# Patient Record
Sex: Male | Born: 1965 | Race: Black or African American | Hispanic: No | Marital: Married | Smoking: Former smoker
Health system: Southern US, Community
[De-identification: ages and names within clinical notes are randomized; demographics above are authoritative.]

---

## 2011-03-14 ENCOUNTER — Emergency Department: Payer: Self-pay | Admitting: *Deleted

## 2011-06-02 ENCOUNTER — Emergency Department: Payer: Self-pay | Admitting: Emergency Medicine

## 2012-03-24 ENCOUNTER — Emergency Department: Payer: Self-pay | Admitting: Emergency Medicine

## 2013-03-23 ENCOUNTER — Emergency Department (HOSPITAL_COMMUNITY): Payer: Self-pay

## 2013-03-23 ENCOUNTER — Emergency Department (HOSPITAL_COMMUNITY)
Admission: EM | Admit: 2013-03-23 | Discharge: 2013-03-23 | Disposition: A | Discharge status: Home / Self Care | Payer: Self-pay | Attending: Emergency Medicine | Admitting: Emergency Medicine

## 2013-03-23 ENCOUNTER — Encounter (HOSPITAL_COMMUNITY): Payer: Self-pay | Admitting: Emergency Medicine

## 2013-03-23 DIAGNOSIS — F17200 Nicotine dependence, unspecified, uncomplicated: Secondary | ICD-10-CM | POA: Insufficient documentation

## 2013-03-23 DIAGNOSIS — Y92009 Unspecified place in unspecified non-institutional (private) residence as the place of occurrence of the external cause: Secondary | ICD-10-CM | POA: Insufficient documentation

## 2013-03-23 DIAGNOSIS — Y9389 Activity, other specified: Secondary | ICD-10-CM | POA: Insufficient documentation

## 2013-03-23 DIAGNOSIS — W298XXA Contact with other powered powered hand tools and household machinery, initial encounter: Secondary | ICD-10-CM | POA: Insufficient documentation

## 2013-03-23 DIAGNOSIS — S0180XA Unspecified open wound of other part of head, initial encounter: Secondary | ICD-10-CM | POA: Insufficient documentation

## 2013-03-23 DIAGNOSIS — S0181XA Laceration without foreign body of other part of head, initial encounter: Secondary | ICD-10-CM

## 2013-03-23 MED ORDER — ONDANSETRON HCL 4 MG/2ML IJ SOLN
4.0000 mg | Freq: Once | INTRAMUSCULAR | Status: AC
  Administered 2013-03-23: 4 mg via INTRAVENOUS
  Filled 2013-03-23: qty 2

## 2013-03-23 MED ORDER — HYDROCODONE-ACETAMINOPHEN 5-325 MG PO TABS: 1.0000 | ORAL_TABLET | ORAL | Status: DC | PRN

## 2013-03-23 MED ORDER — HYDROMORPHONE HCL PF 1 MG/ML IJ SOLN
1.0000 mg | Freq: Once | INTRAMUSCULAR | Status: AC
  Administered 2013-03-23: 1 mg via INTRAVENOUS
  Filled 2013-03-23: qty 1

## 2013-03-23 MED ORDER — TETANUS-DIPHTH-ACELL PERTUSSIS 5-2.5-18.5 LF-MCG/0.5 IM SUSP
0.5000 mL | Freq: Once | INTRAMUSCULAR | Status: AC
  Administered 2013-03-23: 0.5 mL via INTRAMUSCULAR
  Filled 2013-03-23: qty 0.5

## 2013-03-23 NOTE — Consult Note (Signed)
Reason for Consult:facial lacerations Location: [facility] ED- outpatient Date 31915  Rufus Beske is an 48 y.o. male.  HPI: Suffered complex laceration to left brow and eyelid when chainsaw kicked back in face. No LOC. Td received in ED. Wears readers occasionally. Denies eye pain. Denies teeth pain.  History reviewed. No pertinent past medical history.  History reviewed. No pertinent past surgical history.  No family history on file.  Social History:  reports that he has been smoking.  He does not have any smokeless tobacco history on file. He reports that he drinks alcohol. He reports that he does not use illicit drugs.  Allergies: No Known Allergies  Medications: I have reviewed the patient's current medications.  No results found for this or any previous visit (from the past 48 hour(s)).  Ct Head Wo Contrast  03/23/2013   CLINICAL DATA:  Laceration left eye from chain saw accident  EXAM: CT HEAD AND ORBITS WITHOUT CONTRAST  TECHNIQUE: Contiguous axial images were obtained from the base of the skull through the vertex without contrast. Multidetector CT imaging of the orbits was performed using the standard protocol without intravenous contrast.  COMPARISON:  None.  FINDINGS: CT HEAD FINDINGS  Ventricle size is normal. Negative for intracranial hemorrhage, mass, or infarction.  Laceration of the left eyelid and left forehead. No skull fracture identified.  CT ORBITS FINDINGS  Soft tissue laceration over the left eyelid extending into the left forehead soft tissues. The globe has normal signal without evidence of foreign body, gas, or hemorrhage in the globe. No orbital hematoma identified.  Negative for fracture the orbit.  Chronic sinusitis with contracted right maxillary sinus and mucosal thickening. Question prior injury. Mucosal edema in the left sphenoid and maxillary sinus. No air-fluid level in the sinus.  Periapical abscess of  the left upper third molar  IMPRESSION: Normal CT of the  brain.  Soft tissue laceration left eye . No underlying orbital fracture. No ocular injury.   Electronically Signed   By: Franchot Gallo M.D.   On: 03/23/2013 20:12   Ct Orbitss W/o Cm  03/23/2013   CLINICAL DATA:  Laceration left eye from chain saw accident  EXAM: CT HEAD AND ORBITS WITHOUT CONTRAST  TECHNIQUE: Contiguous axial images were obtained from the base of the skull through the vertex without contrast. Multidetector CT imaging of the orbits was performed using the standard protocol without intravenous contrast.  COMPARISON:  None.  FINDINGS: CT HEAD FINDINGS  Ventricle size is normal. Negative for intracranial hemorrhage, mass, or infarction.  Laceration of the left eyelid and left forehead. No skull fracture identified.  CT ORBITS FINDINGS  Soft tissue laceration over the left eyelid extending into the left forehead soft tissues. The globe has normal signal without evidence of foreign body, gas, or hemorrhage in the globe. No orbital hematoma identified.  Negative for fracture the orbit.  Chronic sinusitis with contracted right maxillary sinus and mucosal thickening. Question prior injury. Mucosal edema in the left sphenoid and maxillary sinus. No air-fluid level in the sinus.  Periapical abscess of  the left upper third molar  IMPRESSION: Normal CT of the brain.  Soft tissue laceration left eye . No underlying orbital fracture. No ocular injury.   Electronically Signed   By: Franchot Gallo M.D.   On: 03/23/2013 20:12    ROS Blood pressure 144/101, pulse 97, temperature 98.4 F (36.9 C), temperature source Oral, resp. rate 20, height 5\' 9"  (1.753 m), weight 88.451 kg (195 lb), SpO2 100.00%.  Physical Exam Alert cooperative Absence left 1st molar premorbid, no swelling around left maxillary third molar Laceration left brow through orbicularis and portion frontalis including preseptal muscle to lid margin but not involving lid or conjunctiva Able to raise brows, additions adjacent lacerations  over brow  Assessment/Plan: Complex upper lid and brow laceration. Repair in ED. Rec keep head elevated, ice packs as toelrateed, ok to apply vaseline. Ok to shower 03/25/13 F/u 1 week, contact info provided  Rec Ophthalomology eval as outpt to assess cornea. Rec dentist visit re developing pericapical abscess  PROCEDURE NOTE:  Left supraorbital nerve block completed and wound margins infiltrated with 2% lidocaine with epi 1:100K, total 6 ml. Prepped with Betadine. Sharp excision of frayed skin margins and muscle. Irrigate out gross debris. Closure completed with interrupted 5-0 vicryl in muscle orbicularis and in dermis. Skin closure completed with short running 5-0 plain along brow and forehead. Total length brow/forehead 6.5 cm. Upper eyelid muscle orbicularis approximated in similar fashion and skin closure completed with 6-0 fast gut. Length eyelid closure 2 cmTolerated well.   Irene Limbo, MD [facility] [phone]

## 2013-03-23 NOTE — ED Provider Notes (Signed)
Pt large, deep laceration to left frontal scalp as well as left eyelid He is awake/alert, GCS 15 Will obtain CT imaging and also consult facial plastics for assistance for wound repair  Sharyon Cable, MD 03/23/13 1845

## 2013-03-23 NOTE — ED Notes (Signed)
Pt undressed, in gown, on continuous pulse oximetry and blood pressure cuff 

## 2013-03-23 NOTE — ED Notes (Signed)
Pt handed an urinal to use; wife assisting

## 2013-03-23 NOTE — Discharge Instructions (Signed)
Facial Laceration ° A facial laceration is a cut on the face. These injuries can be painful and cause bleeding. Lacerations usually heal quickly, but they need special care to reduce scarring. °DIAGNOSIS  °Your health care provider will take a medical history, ask for details about how the injury occurred, and examine the wound to determine how deep the cut is. °TREATMENT  °Some facial lacerations may not require closure. Others may not be able to be closed because of an increased risk of infection. The risk of infection and the chance for successful closure will depend on various factors, including the amount of time since the injury occurred. °The wound may be cleaned to help prevent infection. If closure is appropriate, pain medicines may be given if needed. Your health care provider will use stitches (sutures), wound glue (adhesive), or skin adhesive strips to repair the laceration. These tools bring the skin edges together to allow for faster healing and a better cosmetic outcome. If needed, you may also be given a tetanus shot. °HOME CARE INSTRUCTIONS °· Only take over-the-counter or prescription medicines as directed by your health care provider. °· Follow your health care provider's instructions for wound care. These instructions will vary depending on the technique used for closing the wound. °For Sutures: °· Keep the wound clean and dry.   °· If you were given a bandage (dressing), you should change it at least once a day. Also change the dressing if it becomes wet or dirty, or as directed by your health care provider.   °· Wash the wound with soap and water 2 times a day. Rinse the wound off with water to remove all soap. Pat the wound dry with a clean towel.   °· After cleaning, apply a thin layer of the antibiotic ointment recommended by your health care provider. This will help prevent infection and keep the dressing from sticking.   °· You may shower as usual after the first 24 hours. Do not soak the  wound in water until the sutures are removed.   °· Get your sutures removed as directed by your health care provider. With facial lacerations, sutures should usually be taken out after 4 5 days to avoid stitch marks.   °· Wait a few days after your sutures are removed before applying any makeup. °For Skin Adhesive Strips: °· Keep the wound clean and dry.   °· Do not get the skin adhesive strips wet. You may bathe carefully, using caution to keep the wound dry.   °· If the wound gets wet, pat it dry with a clean towel.   °· Skin adhesive strips will fall off on their own. You may trim the strips as the wound heals. Do not remove skin adhesive strips that are still stuck to the wound. They will fall off in time.   °For Wound Adhesive: °· You may briefly wet your wound in the shower or bath. Do not soak or scrub the wound. Do not swim. Avoid periods of heavy sweating until the skin adhesive has fallen off on its own. After showering or bathing, gently pat the wound dry with a clean towel.   °· Do not apply liquid medicine, cream medicine, ointment medicine, or makeup to your wound while the skin adhesive is in place. This may loosen the film before your wound is healed.   °· If a dressing is placed over the wound, be careful not to apply tape directly over the skin adhesive. This may cause the adhesive to be pulled off before the wound is healed.   °·   Avoid prolonged exposure to sunlight or tanning lamps while the skin adhesive is in place. °· The skin adhesive will usually remain in place for 5 10 days, then naturally fall off the skin. Do not pick at the adhesive film.   °After Healing: °Once the wound has healed, cover the wound with sunscreen during the day for 1 full year. This can help minimize scarring. Exposure to ultraviolet light in the first year will darken the scar. It can take 1 2 years for the scar to lose its redness and to heal completely.  °SEEK IMMEDIATE MEDICAL CARE IF: °· You have redness, pain, or  swelling around the wound.   °· You see a yellowish-white fluid (pus) coming from the wound.   °· You have chills or a fever.   °MAKE SURE YOU: °· Understand these instructions. °· Will watch your condition. °· Will get help right away if you are not doing well or get worse. °Document Released: 01/30/2004 Document Revised: 10/12/2012 Document Reviewed: 08/04/2012 °[employer]® Patient Information ©2014 [employer]. ° °

## 2013-03-23 NOTE — ED Provider Notes (Signed)
CSN: [id number]     Arrival date & time 03/23/13  1750 History   First MD Initiated Contact with Patient 03/23/13 1756     Chief Complaint  Patient presents with  . Facial Laceration   HPI  48 year old male presents with a facial laceration. One hour prior to arrival, the patient was cutting palates with a chainsaw when the chainsaw kicked back and struck him in the face. He suffered a laceration to his face with minimal blood loss. He did not lose consciousness. No amnesia. No headache. No nausea or vomiting. No loss of vision. His eye is uninjured. He complains of severe pain to his face. Pain is 10/10. He denies any other injury. Denies neck pain. No treatments tried.  Pain is aching.  Aggravated by touching his face.  Relieved by nothing.   History reviewed. No pertinent past medical history. History reviewed. No pertinent past surgical history. No family history on file. History  Substance Use Topics  . Smoking status: Current Every Day Smoker  . Smokeless tobacco: Not on file  . Alcohol Use: Yes    Review of Systems  Constitutional: Negative for fever and chills.  HENT: Negative for congestion and rhinorrhea.   Eyes: Negative for visual disturbance.  Respiratory: Negative for cough and shortness of breath.   Cardiovascular: Negative for chest pain and leg swelling.  Gastrointestinal: Negative for nausea, vomiting, abdominal pain and diarrhea.  Genitourinary: Negative for dysuria, urgency, frequency, flank pain and difficulty urinating.  Musculoskeletal: Negative for back pain, neck pain and neck stiffness.  Skin: Negative for rash.  Neurological: Negative for syncope, weakness, numbness and headaches.  All other systems reviewed and are negative.      Allergies  Review of patient's allergies indicates no known allergies.  Home Medications   Current Outpatient Rx  Name  Route  Sig  Dispense  Refill  . Multiple Vitamin (MULTI-VITAMIN PO)   Oral   Take 1 tablet by  mouth daily.         Marland Kitchen HYDROcodone-acetaminophen (NORCO/VICODIN) 5-325 MG per tablet   Oral   Take 1 tablet by mouth every 4 (four) hours as needed.   15 tablet   0    BP 150/93  Pulse 90  Temp(Src) 98.4 F (36.9 C) (Oral)  Resp 16  Ht 5\' 9"  (1.753 m)  Wt 195 lb (88.451 kg)  BMI 28.78 kg/m2  SpO2 96% Physical Exam  Nursing note and vitals reviewed. Constitutional: He is oriented to person, place, and time. He appears well-developed and well-nourished. No distress.  HENT:  Head: Normocephalic. Head is with laceration (large, greater than 10 cm, involving forehead, left eyelid, muscle of forehead.  hemostatic.  no foreign body.  no injury to nerve or major blood vessel).  Mouth/Throat: Oropharynx is clear and moist.  Eyes: Conjunctivae and EOM are normal. Pupils are equal, round, and reactive to light. No scleral icterus.  Neck: Normal range of motion. Neck supple. No JVD present.  Cardiovascular: Normal rate, regular rhythm, normal heart sounds and intact distal pulses.  Exam reveals no gallop and no friction rub.   No murmur heard. Pulmonary/Chest: Effort normal and breath sounds normal. No respiratory distress. He has no wheezes. He has no rales.  Abdominal: Soft. Bowel sounds are normal. He exhibits no distension. There is no tenderness. There is no rebound and no guarding.  Musculoskeletal: He exhibits no edema.  Neurological: He is alert and oriented to person, place, and time. He has normal strength. No  cranial nerve deficit or sensory deficit. He exhibits normal muscle tone. Coordination normal. GCS eye subscore is 4. GCS verbal subscore is 5. GCS motor subscore is 6.  Skin: Skin is warm and dry. He is not diaphoretic.    ED Course  Procedures (including critical care time) Labs Review Labs Reviewed - No data to display Imaging Review Ct Head Wo Contrast  03/23/2013   CLINICAL DATA:  Laceration left eye from chain saw accident  EXAM: CT HEAD AND ORBITS WITHOUT  CONTRAST  TECHNIQUE: Contiguous axial images were obtained from the base of the skull through the vertex without contrast. Multidetector CT imaging of the orbits was performed using the standard protocol without intravenous contrast.  COMPARISON:  None.  FINDINGS: CT HEAD FINDINGS  Ventricle size is normal. Negative for intracranial hemorrhage, mass, or infarction.  Laceration of the left eyelid and left forehead. No skull fracture identified.  CT ORBITS FINDINGS  Soft tissue laceration over the left eyelid extending into the left forehead soft tissues. The globe has normal signal without evidence of foreign body, gas, or hemorrhage in the globe. No orbital hematoma identified.  Negative for fracture the orbit.  Chronic sinusitis with contracted right maxillary sinus and mucosal thickening. Question prior injury. Mucosal edema in the left sphenoid and maxillary sinus. No air-fluid level in the sinus.  Periapical abscess of  the left upper third molar  IMPRESSION: Normal CT of the brain.  Soft tissue laceration left eye . No underlying orbital fracture. No ocular injury.   Electronically Signed   By: Franchot Gallo M.D.   On: 03/23/2013 20:12   Ct Orbitss W/o Cm  03/23/2013   CLINICAL DATA:  Laceration left eye from chain saw accident  EXAM: CT HEAD AND ORBITS WITHOUT CONTRAST  TECHNIQUE: Contiguous axial images were obtained from the base of the skull through the vertex without contrast. Multidetector CT imaging of the orbits was performed using the standard protocol without intravenous contrast.  COMPARISON:  None.  FINDINGS: CT HEAD FINDINGS  Ventricle size is normal. Negative for intracranial hemorrhage, mass, or infarction.  Laceration of the left eyelid and left forehead. No skull fracture identified.  CT ORBITS FINDINGS  Soft tissue laceration over the left eyelid extending into the left forehead soft tissues. The globe has normal signal without evidence of foreign body, gas, or hemorrhage in the globe. No  orbital hematoma identified.  Negative for fracture the orbit.  Chronic sinusitis with contracted right maxillary sinus and mucosal thickening. Question prior injury. Mucosal edema in the left sphenoid and maxillary sinus. No air-fluid level in the sinus.  Periapical abscess of  the left upper third molar  IMPRESSION: Normal CT of the brain.  Soft tissue laceration left eye . No underlying orbital fracture. No ocular injury.   Electronically Signed   By: Franchot Gallo M.D.   On: 03/23/2013 20:12     EKG Interpretation None      MDM   Thai Burgueno is a 48 y.o. male presents with complex lac to forehead and L eyelid from chainsaw.  No LOC.  No injury to eye.  No other injury.  CT head and orbits with no orbital injury, no intracranial trauma.  Gave tetanus. Lac repaired at bedside by plastic surgery.  See plastic surgery procedure note. Discharged home with Norco. Gave infection return precautions. To f/u with plastic surgery in 6 days for suture removal.   Final diagnoses:  Facial laceration     Wendall Papa, MD 03/23/13  2342 

## 2013-03-23 NOTE — ED Notes (Signed)
Per [facility], pt from home after cutting pallets with chainsaw and the chainsaw kicked back, has a 2 cm laceration above left eye that gaps when he moves his eyebrow. Given 100 mcg Fentanyl. Pt states it feels like pressure at his eye. 18g to RAC.

## 2013-03-25 NOTE — ED Provider Notes (Signed)
I have personally seen and examined the patient.  I have discussed the plan of care with the resident.  I have reviewed the documentation on PMH/FH/Soc. History.  I have reviewed the documentation of the resident and agree.   Sharyon Cable, MD 03/25/13 [id number]

## 2017-01-30 ENCOUNTER — Emergency Department: Payer: Self-pay

## 2017-01-30 ENCOUNTER — Emergency Department
Admission: EM | Admit: 2017-01-30 | Discharge: 2017-01-30 | Disposition: A | Discharge status: Home / Self Care | Payer: Self-pay | Attending: Emergency Medicine | Admitting: Emergency Medicine

## 2017-01-30 DIAGNOSIS — J101 Influenza due to other identified influenza virus with other respiratory manifestations: Secondary | ICD-10-CM | POA: Insufficient documentation

## 2017-01-30 DIAGNOSIS — R911 Solitary pulmonary nodule: Secondary | ICD-10-CM | POA: Insufficient documentation

## 2017-01-30 DIAGNOSIS — Z79899 Other long term (current) drug therapy: Secondary | ICD-10-CM | POA: Insufficient documentation

## 2017-01-30 DIAGNOSIS — F17200 Nicotine dependence, unspecified, uncomplicated: Secondary | ICD-10-CM | POA: Insufficient documentation

## 2017-01-30 LAB — INFLUENZA PANEL BY PCR (TYPE A & B)
INFLAPCR: POSITIVE — AB
Influenza B By PCR: NEGATIVE

## 2017-01-30 LAB — BASIC METABOLIC PANEL
ANION GAP: 9 (ref 5–15)
BUN: 13 mg/dL (ref 6–20)
CHLORIDE: 103 mmol/L (ref 101–111)
CO2: 20 mmol/L — AB (ref 22–32)
Calcium: 9 mg/dL (ref 8.9–10.3)
Creatinine, Ser: 1.02 mg/dL (ref 0.61–1.24)
GFR calc Af Amer: >60 mL/min (ref >60)
Glucose, Bld: 105 mg/dL — ABNORMAL HIGH (ref 65–99)
POTASSIUM: 4.2 mmol/L (ref 3.5–5.1)
Sodium: 132 mmol/L — ABNORMAL LOW (ref 135–145)

## 2017-01-30 LAB — CBC
HCT: 45.6 % (ref 40.0–52.0)
HEMOGLOBIN: 15.2 g/dL (ref 13.0–18.0)
MCH: 31.4 pg (ref 26.0–34.0)
MCHC: 33.4 g/dL (ref 32.0–36.0)
MCV: 94.1 fL (ref 80.0–100.0)
Platelets: 174 10*3/uL (ref 150–440)
RBC: 4.84 MIL/uL (ref 4.40–5.90)
RDW: 13.7 % (ref 11.5–14.5)
WBC: 6.8 10*3/uL (ref 3.8–10.6)

## 2017-01-30 LAB — TROPONIN I: Troponin I: <0.03 ng/mL (ref <0.03)

## 2017-01-30 MED ORDER — ACETAMINOPHEN 325 MG PO TABS
650.0000 mg | ORAL_TABLET | Freq: Once | ORAL | Status: AC | PRN
  Administered 2017-01-30: 650 mg via ORAL
  Filled 2017-01-30: qty 2

## 2017-01-30 MED ORDER — GUAIFENESIN-CODEINE 100-10 MG/5ML PO SOLN: 10.0000 mL | Freq: Three times a day (TID) | ORAL | 0 refills | Status: DC | PRN

## 2017-01-30 MED ORDER — AZITHROMYCIN 250 MG PO TABS: ORAL_TABLET | ORAL | 0 refills | Status: DC

## 2017-01-30 MED ORDER — PREDNISONE 10 MG PO TABS: 50.0000 mg | ORAL_TABLET | Freq: Every day | ORAL | 0 refills | Status: DC

## 2017-01-30 NOTE — ED Provider Notes (Signed)
[facility] Emergency Department Provider Note  ____________________________________________  Time seen: Approximately 6:17 PM  I have reviewed the triage vital signs and the nursing notes.   HISTORY  Chief Complaint Generalized Body Aches; Cough; and Chest Pain   HPI Hunter [name] is a 52 y.o. male presents to the emergency department for evaluation and treatment of generalized body aches, chest pain, fever, and cough.  Symptoms started approximately 4 days ago.  No alleviating measures attempted for this complaint prior to arrival.  History reviewed. No pertinent past medical history.  There are no active problems to display for this patient.   History reviewed. No pertinent surgical history.  Prior to Admission medications   Medication Sig Start Date End Date Taking? Authorizing Provider  azithromycin (ZITHROMAX) 250 MG tablet 2 tablets today, then 1 tablet for the next 4 days. 01/30/17   Jaramiah Bossard B, FNP  guaiFENesin-codeine 100-10 MG/5ML syrup Take 10 mLs by mouth 3 (three) times daily as needed. 01/30/17   Marchia Diguglielmo, Johnette Abraham B, FNP  HYDROcodone-acetaminophen (NORCO/VICODIN) 5-325 MG per tablet Take 1 tablet by mouth every 4 (four) hours as needed. 03/23/13   Wendall Papa, MD  Multiple Vitamin (MULTI-VITAMIN PO) Take 1 tablet by mouth daily.    [provider]  predniSONE (DELTASONE) 10 MG tablet Take 5 tablets (50 mg total) by mouth daily. 01/30/17   Victorino Dike, FNP    Allergies Patient has no known allergies.  No family history on file.  Social History Social History   Tobacco Use  . Smoking status: Current Every Day Smoker  Substance Use Topics  . Alcohol use: Yes  . Drug use: No    Review of Systems Constitutional: Positive for fever/chills ENT: Positive for sore throat. Cardiovascular: Denies chest pain. Respiratory: Negative for shortness of breath.  Positive for cough. Gastrointestinal: Negative for nausea, no  vomiting.  No diarrhea.  Musculoskeletal: Positive for body aches Skin: Negative for rash. Neurological: Positive for headaches ____________________________________________   PHYSICAL EXAM:  VITAL SIGNS: ED Triage Vitals [01/30/17 1701]  Enc Vitals Group     BP (!) 159/88     Pulse Rate (!) 101     Resp 18     Temp (!) 101.4 F (38.6 C)     Temp src      SpO2 98 %     Weight 195 lb (88.5 kg)     Height      Head Circumference      Peak Flow      Pain Score      Pain Loc      Pain Edu?      Excl. in [place]?     Constitutional: Alert and oriented.  Acutely ill appearing and in no acute distress. Eyes: Conjunctivae are normal. EOMI. Ears: Bilateral tympanic membranes are injected but without evidence of otitis media Nose: Sinus congestion noted; no rhinnorhea. Mouth/Throat: Mucous membranes are moist.  Oropharynx mildly erythematous. Tonsils 1+ without exudate. Neck: No stridor.  Lymphatic: Bilateral anterior cervical lymphadenopathy. Cardiovascular: Normal rate, regular rhythm. Good peripheral circulation. Respiratory: Normal respiratory effort.  No retractions.  Breath sounds diminished but clear throughout.. Gastrointestinal: Soft and nontender.  Musculoskeletal: FROM x 4 extremities.  Neurologic:  Normal speech and language.  Skin:  Skin is warm, dry and intact. No rash noted. Psychiatric: Mood and affect are normal. Speech and behavior are normal.  ____________________________________________   LABS (all labs ordered are listed, but only abnormal results are displayed)  Labs Reviewed  BASIC METABOLIC PANEL - Abnormal; Notable for the following components:      Result Value   Sodium 132 (*)    CO2 20 (*)    Glucose, Bld 105 (*)    All other components within normal limits  INFLUENZA PANEL BY PCR (TYPE A & B) - Abnormal; Notable for the following components:   Influenza A By PCR POSITIVE (*)    All other components within normal limits  CBC  TROPONIN I    ____________________________________________  EKG  Not indicated ____________________________________________  RADIOLOGY  Chest x-ray shows no acute cardiopulmonary findings, however there is a nodular density at the right lung base which could potentially be a nipple shadow but does not have a contralateral similar finding. ____________________________________________   PROCEDURES  Procedure(s) performed: None  Critical Care performed: No ____________________________________________   INITIAL IMPRESSION / ASSESSMENT AND PLAN / ED COURSE  52 year old male presenting to the emergency department for exam and symptoms most consistent with influenza.  Influenza A was positive on PCR.  Chest x-ray does not show any acute findings, however it does raise the question of pulmonary nodule.  Because he smokes, has a persistent cough, and does not have good access to health care provider chest CT will be completed tonight.  After CT resulted, results were discussed with the patient who was encouraged to call [facility] and request an appointment.  Because there is a possibility that the groundglass appearing nodular area could be infectious, he will be treated with azithromycin.  He will also be given a prescription for prednisone and guaifenesin with codeine.  Patient was advised to return to the emergency department for symptoms that change or worsen if he is unable to schedule appointment with his primary care provider.  Medications  acetaminophen (TYLENOL) tablet 650 mg (650 mg Oral Given 01/30/17 1753)    ED Discharge Orders        Ordered    azithromycin (ZITHROMAX) 250 MG tablet     01/30/17 1934    predniSONE (DELTASONE) 10 MG tablet  Daily     01/30/17 1934    guaiFENesin-codeine 100-10 MG/5ML syrup  3 times daily PRN     01/30/17 1934      Pertinent labs & imaging results that were available during my care of the patient were reviewed by me and considered  in my medical decision making (see chart for details).    If controlled substance prescribed during this visit, 12 month history viewed on the [facility] prior to issuing an initial prescription for Schedule II or III opiod. ____________________________________________   FINAL CLINICAL IMPRESSION(S) / ED DIAGNOSES  Final diagnoses:  Influenza A  Pulmonary nodule, right    Note:  This document was prepared using Dragon voice recognition software and may include unintentional dictation errors.     Victorino Dike, FNP 01/30/17 2114    Carrie Mew, MD 01/30/17 2243

## 2017-01-30 NOTE — ED Triage Notes (Signed)
Pt came to ED via pov c/o generalized body aches, chest pain, fever. Temperature 101.4 in triage. Took motrin earlier today. Coughx 3 days.

## 2017-01-30 NOTE — ED Notes (Signed)
Pt in XR. 

## 2017-11-29 ENCOUNTER — Other Ambulatory Visit: Payer: Self-pay

## 2017-11-29 ENCOUNTER — Encounter: Payer: Self-pay | Admitting: Emergency Medicine

## 2017-11-29 ENCOUNTER — Emergency Department
Admission: EM | Admit: 2017-11-29 | Discharge: 2017-11-29 | Disposition: A | Discharge status: Home / Self Care | Payer: Self-pay | Attending: Emergency Medicine | Admitting: Emergency Medicine

## 2017-11-29 DIAGNOSIS — F17210 Nicotine dependence, cigarettes, uncomplicated: Secondary | ICD-10-CM | POA: Insufficient documentation

## 2017-11-29 DIAGNOSIS — Y92008 Other place in unspecified non-institutional (private) residence as the place of occurrence of the external cause: Secondary | ICD-10-CM | POA: Insufficient documentation

## 2017-11-29 DIAGNOSIS — Y999 Unspecified external cause status: Secondary | ICD-10-CM | POA: Insufficient documentation

## 2017-11-29 DIAGNOSIS — W450XXA Nail entering through skin, initial encounter: Secondary | ICD-10-CM | POA: Insufficient documentation

## 2017-11-29 DIAGNOSIS — S61214A Laceration without foreign body of right ring finger without damage to nail, initial encounter: Secondary | ICD-10-CM | POA: Insufficient documentation

## 2017-11-29 DIAGNOSIS — Y9389 Activity, other specified: Secondary | ICD-10-CM | POA: Insufficient documentation

## 2017-11-29 MED ORDER — SULFAMETHOXAZOLE-TRIMETHOPRIM 800-160 MG PO TABS: 1.0000 | ORAL_TABLET | Freq: Two times a day (BID) | ORAL | 0 refills | Status: DC

## 2017-11-29 NOTE — ED Provider Notes (Signed)
[facility] Emergency Department Provider Note ____________________________________________  Time seen: 55  I have reviewed the triage vital signs and the nursing notes.  HISTORY  Chief Complaint  Laceration  HPI Hunter [name] is a 52 y.o. male who presents himself to the ED for evaluation of accidental laceration to his right ring finger.  He describes accident occurred last night at about 10 PM.  He was in his storage/work shed, when he was apparently tossing several 4 x 4's.  1 of the 4 x 4's had an exposed nail it, and when he tossed it, the nail gouged his right finger in the palm.  He presents today after finally getting the wound to stop bleeding last night, with none coapted wound edges to the ring finger.  No signs of infection are appreciated.  Patient does admit that the nail was old and rusty.  He does report a current tetanus status.  He otherwise has normal sensation and normal grip to the right hand.  History reviewed. No pertinent past medical history.  There are no active problems to display for this patient.  History reviewed. No pertinent surgical history.  Prior to Admission medications   Medication Sig Start Date End Date Taking? Authorizing Provider  azithromycin (ZITHROMAX) 250 MG tablet 2 tablets today, then 1 tablet for the next 4 days. 01/30/17   Triplett, Cari B, FNP  guaiFENesin-codeine 100-10 MG/5ML syrup Take 10 mLs by mouth 3 (three) times daily as needed. 01/30/17   Triplett, Johnette Abraham B, FNP  HYDROcodone-acetaminophen (NORCO/VICODIN) 5-325 MG per tablet Take 1 tablet by mouth every 4 (four) hours as needed. 03/23/13   Wendall Papa, MD  Multiple Vitamin (MULTI-VITAMIN PO) Take 1 tablet by mouth daily.    [provider]  predniSONE (DELTASONE) 10 MG tablet Take 5 tablets (50 mg total) by mouth daily. 01/30/17   Triplett, Johnette Abraham B, FNP  sulfamethoxazole-trimethoprim (BACTRIM DS,SEPTRA DS) 800-160 MG tablet Take 1 tablet by mouth 2  (two) times daily. 11/29/17   Markevious Ehmke, Dannielle Karvonen, PA-C    Allergies Patient has no known allergies.  No family history on file.  Social History Social History   Tobacco Use  . Smoking status: Current Every Day Smoker  . Smokeless tobacco: Never Used  Substance Use Topics  . Alcohol use: Yes  . Drug use: No    Review of Systems  Constitutional: Negative for fever. Cardiovascular: Negative for chest pain. Respiratory: Negative for shortness of breath. Musculoskeletal: Negative for back pain. Skin: Negative for rash.  Right ring finger laceration as above. Neurological: Negative for headaches, focal weakness or numbness. ____________________________________________  PHYSICAL EXAM:  VITAL SIGNS: ED Triage Vitals  Enc Vitals Group     BP 11/29/17 1529 (!) 138/96     Pulse Rate 11/29/17 1425 88     Resp 11/29/17 1425 18     Temp 11/29/17 1425 98 F (36.7 C)     Temp Source 11/29/17 1425 Oral     SpO2 11/29/17 1425 100 %     Weight 11/29/17 1421 195 lb (88.5 kg)     Height 11/29/17 1421 5\' 9"  (1.753 m)     Head Circumference --      Peak Flow --      Pain Score 11/29/17 1421 0     Pain Loc --      Pain Edu? --      Excl. in [place]? --     Constitutional: Alert and oriented. Well appearing and  in no distress. Head: Normocephalic and atraumatic. Eyes: Conjunctivae are normal. Normal extraocular movements Cardiovascular: Normal rate, regular rhythm. Normal distal pulses capillary refill. Respiratory: Normal respiratory effort.  Musculoskeletal: Normal composite fist on the right hand.  Right ring finger with a linear laceration along the middle and proximal phalanges.  No purulent drainage is appreciated.  No active bleeding is noted currently.  The wound edges are slightly dehisced showing underlying subcu tissue.  Nontender with normal range of motion in all extremities.  Neurologic:  Normal gross sensation.  Normal intrinsic and opposition testing.  Normal speech  and language. No gross focal neurologic deficits are appreciated. Skin:  Skin is warm, dry and intact. No rash noted. ____________________________________________  PROCEDURES  .Marland KitchenLaceration Repair Date/Time: 11/29/2017 5:24 PM Performed by: Melvenia Needles, PA-C Authorized by: Melvenia Needles, PA-C   Consent:    Consent obtained:  Verbal   Consent given by:  Patient   Risks discussed:  Poor wound healing and infection   Alternatives discussed:  No treatment Anesthesia (see MAR for exact dosages):    Anesthesia method:  None Laceration details:    Location:  Finger   Finger location:  R ring finger   Length (cm):  3 Repair type:    Repair type:  Simple Exploration:    Contaminated: yes   Treatment:    Area cleansed with:  Soap and water   Amount of cleaning:  Standard Skin repair:    Repair method:  Steri-Strips   Number of Steri-Strips:  4 Approximation:    Approximation:  Close Post-procedure details:    Dressing:  Non-adherent dressing and splint for protection   Patient tolerance of procedure:  Tolerated well, no immediate complications  ____________________________________________  INITIAL IMPRESSION / ASSESSMENT AND PLAN / ED COURSE  Patient with delayed ED presentation for an accidental laceration to the right ring finger.  The patient has a linear laceration along the palmar aspect of the right ring finger.  No obvious signs of infection are noted.  Because it was delayed in presentation, the discussion was had about potential increased infection.  Because the wound is superficial, we opted to closed with Steri-Strips as opposed to glue and/or delayed closure with sutures and later time.  Patient was agreeable to the plan.  The wound was cleansed and the wound edges are satisfactorily approximated using Steri-Strips.  Patient is placed in a finger splint for added protection.  He will follow-up with his primary provider or return to the ED as needed.  A  prescription for Bactrim is also provided for empiric antibiotic coverage.  Return precautions have been reviewed. ____________________________________________  FINAL CLINICAL IMPRESSION(S) / ED DIAGNOSES  Final diagnoses:  Laceration of right ring finger without foreign body without damage to nail, initial encounter      Melvenia Needles, PA-C 11/29/17 1726    Schuyler Amor, MD 11/29/17 [phone]

## 2017-11-29 NOTE — Discharge Instructions (Signed)
You have had your delayed wound closed with steri-strips. Keep the wound clean and covered. Wear the finger splint for protection. Follow-up with the primary provider as needed.

## 2017-11-29 NOTE — ED Triage Notes (Signed)
Presents with a laceration to right ring finger  States he cut his finger on nail last around 10 pm

## 2018-05-30 ENCOUNTER — Other Ambulatory Visit: Payer: Self-pay

## 2018-05-30 ENCOUNTER — Emergency Department: Payer: Self-pay

## 2018-05-30 ENCOUNTER — Inpatient Hospital Stay
Admission: EM | Admit: 2018-05-30 | Discharge: 2018-06-03 | DRG: 580 | Disposition: A | Discharge status: Home / Self Care | Payer: Self-pay | Attending: Family Medicine | Admitting: Family Medicine

## 2018-05-30 ENCOUNTER — Encounter: Payer: Self-pay | Admitting: Emergency Medicine

## 2018-05-30 DIAGNOSIS — Z7982 Long term (current) use of aspirin: Secondary | ICD-10-CM

## 2018-05-30 DIAGNOSIS — Z825 Family history of asthma and other chronic lower respiratory diseases: Secondary | ICD-10-CM

## 2018-05-30 DIAGNOSIS — Z1159 Encounter for screening for other viral diseases: Secondary | ICD-10-CM

## 2018-05-30 DIAGNOSIS — Z818 Family history of other mental and behavioral disorders: Secondary | ICD-10-CM

## 2018-05-30 DIAGNOSIS — S51852A Open bite of left forearm, initial encounter: Principal | ICD-10-CM | POA: Diagnosis present

## 2018-05-30 DIAGNOSIS — L03114 Cellulitis of left upper limb: Secondary | ICD-10-CM | POA: Diagnosis present

## 2018-05-30 DIAGNOSIS — Z23 Encounter for immunization: Secondary | ICD-10-CM

## 2018-05-30 DIAGNOSIS — L0390 Cellulitis, unspecified: Secondary | ICD-10-CM

## 2018-05-30 DIAGNOSIS — F17210 Nicotine dependence, cigarettes, uncomplicated: Secondary | ICD-10-CM | POA: Diagnosis present

## 2018-05-30 DIAGNOSIS — W503XXA Accidental bite by another person, initial encounter: Secondary | ICD-10-CM

## 2018-05-30 LAB — COMPREHENSIVE METABOLIC PANEL
ALT: 24 U/L (ref 0–44)
AST: 21 U/L (ref 15–41)
Albumin: 4.4 g/dL (ref 3.5–5.0)
Alkaline Phosphatase: 58 U/L (ref 38–126)
Anion gap: 7 (ref 5–15)
BUN: 13 mg/dL (ref 6–20)
CO2: 23 mmol/L (ref 22–32)
Calcium: 9.3 mg/dL (ref 8.9–10.3)
Chloride: 107 mmol/L (ref 98–111)
Creatinine, Ser: 0.86 mg/dL (ref 0.61–1.24)
GFR calc Af Amer: >60 mL/min (ref >60)
GFR calc non Af Amer: >60 mL/min (ref >60)
Glucose, Bld: 115 mg/dL — ABNORMAL HIGH (ref 70–99)
Potassium: 4 mmol/L (ref 3.5–5.1)
Sodium: 137 mmol/L (ref 135–145)
Total Bilirubin: 0.5 mg/dL (ref 0.3–1.2)
Total Protein: 7.5 g/dL (ref 6.5–8.1)

## 2018-05-30 LAB — CBC WITH DIFFERENTIAL/PLATELET
Abs Immature Granulocytes: 0.05 10*3/uL (ref 0.00–0.07)
Basophils Absolute: 0.1 10*3/uL (ref 0.0–0.1)
Basophils Relative: 0 %
Eosinophils Absolute: 0.1 10*3/uL (ref 0.0–0.5)
Eosinophils Relative: 1 %
HCT: 42.2 % (ref 39.0–52.0)
Hemoglobin: 14.1 g/dL (ref 13.0–17.0)
Immature Granulocytes: 0 %
Lymphocytes Relative: 11 %
Lymphs Abs: 1.2 10*3/uL (ref 0.7–4.0)
MCH: 31.8 pg (ref 26.0–34.0)
MCHC: 33.4 g/dL (ref 30.0–36.0)
MCV: 95 fL (ref 80.0–100.0)
Monocytes Absolute: 0.9 10*3/uL (ref 0.1–1.0)
Monocytes Relative: 8 %
Neutro Abs: 9.4 10*3/uL — ABNORMAL HIGH (ref 1.7–7.7)
Neutrophils Relative %: 80 %
Platelets: 229 10*3/uL (ref 150–400)
RBC: 4.44 MIL/uL (ref 4.22–5.81)
RDW: 13.2 % (ref 11.5–15.5)
WBC: 11.7 10*3/uL — ABNORMAL HIGH (ref 4.0–10.5)
nRBC: 0 % (ref 0.0–0.2)

## 2018-05-30 LAB — LACTIC ACID, PLASMA
Lactic Acid, Venous: 0.9 mmol/L (ref 0.5–1.9)
Lactic Acid, Venous: 1.2 mmol/L (ref 0.5–1.9)

## 2018-05-30 LAB — SARS CORONAVIRUS 2 BY RT PCR (HOSPITAL ORDER, PERFORMED IN ~~LOC~~ HOSPITAL LAB): SARS Coronavirus 2: NEGATIVE

## 2018-05-30 MED ORDER — SODIUM CHLORIDE 0.9 % IV SOLN
3.0000 g | Freq: Four times a day (QID) | INTRAVENOUS | Status: DC
  Administered 2018-05-30 – 2018-05-31 (×3): 3 g via INTRAVENOUS
  Filled 2018-05-30 (×6): qty 3

## 2018-05-30 MED ORDER — HYDROMORPHONE HCL 1 MG/ML IJ SOLN
1.0000 mg | Freq: Once | INTRAMUSCULAR | Status: AC
  Administered 2018-05-30: 17:00:00 1 mg via INTRAVENOUS
  Filled 2018-05-30: qty 1

## 2018-05-30 MED ORDER — ADULT MULTIVITAMIN W/MINERALS CH
1.0000 | ORAL_TABLET | Freq: Every day | ORAL | Status: DC
  Administered 2018-05-31 – 2018-06-03 (×3): 1 via ORAL
  Filled 2018-05-30 (×4): qty 1

## 2018-05-30 MED ORDER — TRAMADOL HCL 50 MG PO TABS
50.0000 mg | ORAL_TABLET | Freq: Four times a day (QID) | ORAL | Status: DC | PRN
  Administered 2018-05-30 – 2018-06-01 (×5): 50 mg via ORAL
  Filled 2018-05-30 (×6): qty 1

## 2018-05-30 MED ORDER — ONDANSETRON HCL 4 MG/2ML IJ SOLN
4.0000 mg | Freq: Once | INTRAMUSCULAR | Status: AC
  Administered 2018-05-30: 4 mg via INTRAVENOUS
  Filled 2018-05-30: qty 2

## 2018-05-30 MED ORDER — CLINDAMYCIN PHOSPHATE 600 MG/50ML IV SOLN
600.0000 mg | Freq: Once | INTRAVENOUS | Status: AC
  Administered 2018-05-30: 18:00:00 600 mg via INTRAVENOUS
  Filled 2018-05-30: qty 50

## 2018-05-30 MED ORDER — TETANUS-DIPHTH-ACELL PERTUSSIS 5-2.5-18.5 LF-MCG/0.5 IM SUSP
0.5000 mL | Freq: Once | INTRAMUSCULAR | Status: AC
  Administered 2018-05-30: 16:00:00 0.5 mL via INTRAMUSCULAR
  Filled 2018-05-30: qty 0.5

## 2018-05-30 MED ORDER — ACETAMINOPHEN 325 MG PO TABS
650.0000 mg | ORAL_TABLET | Freq: Four times a day (QID) | ORAL | Status: DC | PRN
  Administered 2018-05-31: 06:00:00 650 mg via ORAL
  Filled 2018-05-30: qty 2

## 2018-05-30 MED ORDER — MORPHINE SULFATE (PF) 4 MG/ML IV SOLN
4.0000 mg | Freq: Once | INTRAVENOUS | Status: AC
  Administered 2018-05-30: 16:00:00 4 mg via INTRAVENOUS
  Filled 2018-05-30: qty 1

## 2018-05-30 MED ORDER — ONDANSETRON HCL 4 MG PO TABS: 4.0000 mg | ORAL_TABLET | Freq: Four times a day (QID) | ORAL | Status: DC | PRN

## 2018-05-30 MED ORDER — KETOROLAC TROMETHAMINE 30 MG/ML IJ SOLN
30.0000 mg | Freq: Four times a day (QID) | INTRAMUSCULAR | Status: DC | PRN
  Administered 2018-05-31: 09:00:00 30 mg via INTRAVENOUS
  Filled 2018-05-30: qty 1

## 2018-05-30 MED ORDER — SODIUM CHLORIDE 0.9 % IV BOLUS
1000.0000 mL | Freq: Once | INTRAVENOUS | Status: AC
  Administered 2018-05-30: 16:00:00 1000 mL via INTRAVENOUS

## 2018-05-30 MED ORDER — ONDANSETRON HCL 4 MG/2ML IJ SOLN
4.0000 mg | Freq: Four times a day (QID) | INTRAMUSCULAR | Status: DC | PRN
  Filled 2018-05-30: qty 2

## 2018-05-30 MED ORDER — ACETAMINOPHEN 650 MG RE SUPP: 650.0000 mg | Freq: Four times a day (QID) | RECTAL | Status: DC | PRN

## 2018-05-30 MED ORDER — ENOXAPARIN SODIUM 40 MG/0.4ML ~~LOC~~ SOLN: 40.0000 mg | SUBCUTANEOUS | Status: DC

## 2018-05-30 MED ORDER — PIPERACILLIN-TAZOBACTAM 3.375 G IVPB 30 MIN
3.3750 g | Freq: Once | INTRAVENOUS | Status: AC
  Administered 2018-05-30: 3.375 g via INTRAVENOUS
  Filled 2018-05-30: qty 50
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
## 2018-05-30 NOTE — Consult Note (Signed)
Pharmacy Antibiotic Note  Hunter [name] is a 53 y.o. male admitted on 05/30/2018 with cellulitis.  Pharmacy has been consulted for Unasyn dosing.  Plan: Start Unasyn 3g IV every 6 hours  Height: 5\' 9"  (175.3 cm) Weight: 205 lb (93 kg) IBW/kg (Calculated) : 70.7  Temp (24hrs), Avg:100.3 F (37.9 C), Min:100.3 F (37.9 C), Max:100.3 F (37.9 C)  Recent Labs  Lab 05/30/18 1533  WBC 11.7*  CREATININE 0.86  LATICACIDVEN 0.9    Estimated Creatinine Clearance: 114.4 mL/min (by C-G formula based on SCr of 0.86 mg/dL).    No Known Allergies  Antimicrobials this admission: 5/25 clindamycin >> x1 dose 5/25 Unasyn >>    Thank you for allowing pharmacy to be a part of this patient's care.  Pernell Dupre, PharmD, BCPS Clinical Pharmacist 05/30/2018 6:13 PM
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
## 2018-05-30 NOTE — ED Notes (Signed)
Patient given sandwich tray and cola. Sitting up in bed eating at this time.

## 2018-05-31 LAB — CBC
HCT: 41.1 % (ref 39.0–52.0)
Hemoglobin: 14 g/dL (ref 13.0–17.0)
MCH: 32.8 pg (ref 26.0–34.0)
MCHC: 34.1 g/dL (ref 30.0–36.0)
MCV: 96.3 fL (ref 80.0–100.0)
Platelets: 253 10*3/uL (ref 150–400)
RBC: 4.27 MIL/uL (ref 4.22–5.81)
RDW: 13.3 % (ref 11.5–15.5)
WBC: 15.8 10*3/uL — ABNORMAL HIGH (ref 4.0–10.5)
nRBC: 0 % (ref 0.0–0.2)

## 2018-05-31 MED ORDER — NICOTINE 21 MG/24HR TD PT24
21.0000 mg | MEDICATED_PATCH | Freq: Every day | TRANSDERMAL | Status: DC
  Administered 2018-05-31 – 2018-06-03 (×4): 21 mg via TRANSDERMAL
  Filled 2018-05-31 (×4): qty 1

## 2018-05-31 MED ORDER — METRONIDAZOLE IN NACL 5-0.79 MG/ML-% IV SOLN
500.0000 mg | Freq: Three times a day (TID) | INTRAVENOUS | Status: DC
  Administered 2018-05-31 – 2018-06-01 (×3): 500 mg via INTRAVENOUS
  Filled 2018-05-31 (×5): qty 100

## 2018-05-31 MED ORDER — VANCOMYCIN HCL 10 G IV SOLR
2000.0000 mg | Freq: Once | INTRAVENOUS | Status: AC
  Administered 2018-05-31: 16:00:00 2000 mg via INTRAVENOUS
  Filled 2018-05-31: qty 2000

## 2018-05-31 MED ORDER — VANCOMYCIN HCL 1.5 G IV SOLR
1500.0000 mg | Freq: Two times a day (BID) | INTRAVENOUS | Status: DC
  Administered 2018-06-01 – 2018-06-03 (×6): 1500 mg via INTRAVENOUS
  Filled 2018-05-31 (×9): qty 1500
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
## 2018-05-31 NOTE — Consult Note (Signed)
Pharmacy Antibiotic Note  JOSEMANUEL EAKINS is a 53 y.o. male admitted on 05/30/2018 with cellulitis.  Pharmacy has been consulted for Vancomycin dosing. Patient was previously ordered Unasyn but has since been discontinued.  Plan: Vancomycin 1500 mg IV Q 12 hrs. Goal AUC 400-550. Expected AUC: 504.9 SCr used: 0.86 Expected Css: 13.5 T1/2: 7.8 hr   Height: 5\' 9"  (175.3 cm) Weight: 205 lb (93 kg) IBW/kg (Calculated) : 70.7  Temp (24hrs), Avg:99.7 F (37.6 C), Min:98.5 F (36.9 C), Max:101 F (38.3 C)  Recent Labs  Lab 05/30/18 1533 05/30/18 2133 05/31/18 0457  WBC 11.7*  --  15.8*  CREATININE 0.86  --   --   LATICACIDVEN 0.9 1.2  --     Estimated Creatinine Clearance: 114.4 mL/min (by C-G formula based on SCr of 0.86 mg/dL).    No Known Allergies  Antimicrobials this admission: 5/26 Vancomycin >>  5/25 clindamycin >> x1 dose 5/25 Unasyn >> 5/26    Thank you for allowing pharmacy to be a part of this patient's care.  Kaisyn Reinhold A Bari Leib 05/31/2018 2:05 PM

## 2018-05-31 NOTE — Progress Notes (Signed)
Pt in no acute distress at this time. LUE is still very swollen, keeping elevated. Pt has full sensation except in L FA where swelling is greatest. Can feel fingers and hand, and can make a fist.

## 2018-06-01 ENCOUNTER — Observation Stay: Payer: Self-pay

## 2018-06-01 DIAGNOSIS — L03114 Cellulitis of left upper limb: Secondary | ICD-10-CM

## 2018-06-01 DIAGNOSIS — S51852A Open bite of left forearm, initial encounter: Principal | ICD-10-CM

## 2018-06-01 DIAGNOSIS — M609 Myositis, unspecified: Secondary | ICD-10-CM

## 2018-06-01 DIAGNOSIS — F17210 Nicotine dependence, cigarettes, uncomplicated: Secondary | ICD-10-CM

## 2018-06-01 DIAGNOSIS — L0390 Cellulitis, unspecified: Secondary | ICD-10-CM | POA: Diagnosis present

## 2018-06-01 LAB — CBC
HCT: 37.7 % — ABNORMAL LOW (ref 39.0–52.0)
Hemoglobin: 12.5 g/dL — ABNORMAL LOW (ref 13.0–17.0)
MCH: 31.4 pg (ref 26.0–34.0)
MCHC: 33.2 g/dL (ref 30.0–36.0)
MCV: 94.7 fL (ref 80.0–100.0)
Platelets: 207 10*3/uL (ref 150–400)
RBC: 3.98 MIL/uL — ABNORMAL LOW (ref 4.22–5.81)
RDW: 13 % (ref 11.5–15.5)
WBC: 16.3 10*3/uL — ABNORMAL HIGH (ref 4.0–10.5)
nRBC: 0 % (ref 0.0–0.2)

## 2018-06-01 LAB — BASIC METABOLIC PANEL
Anion gap: 7 (ref 5–15)
BUN: 10 mg/dL (ref 6–20)
CO2: 22 mmol/L (ref 22–32)
Calcium: 8.5 mg/dL — ABNORMAL LOW (ref 8.9–10.3)
Chloride: 104 mmol/L (ref 98–111)
Creatinine, Ser: 1.03 mg/dL (ref 0.61–1.24)
GFR calc Af Amer: >60 mL/min (ref >60)
GFR calc non Af Amer: >60 mL/min (ref >60)
Glucose, Bld: 107 mg/dL — ABNORMAL HIGH (ref 70–99)
Potassium: 3.9 mmol/L (ref 3.5–5.1)
Sodium: 133 mmol/L — ABNORMAL LOW (ref 135–145)

## 2018-06-01 LAB — HIV ANTIBODY (ROUTINE TESTING W REFLEX): HIV Screen 4th Generation wRfx: NONREACTIVE

## 2018-06-01 MED ORDER — SODIUM CHLORIDE 0.9 % IV SOLN
3.0000 g | Freq: Four times a day (QID) | INTRAVENOUS | Status: DC
  Administered 2018-06-02 – 2018-06-03 (×6): 3 g via INTRAVENOUS
  Filled 2018-06-01 (×12): qty 3

## 2018-06-01 MED ORDER — VANCOMYCIN HCL IN DEXTROSE 1-5 GM/200ML-% IV SOLN: 1000.0000 mg | Freq: Once | INTRAVENOUS | Status: DC

## 2018-06-01 MED ORDER — SODIUM CHLORIDE 0.9% FLUSH: 10.0000 mL | INTRAVENOUS | Status: DC | PRN

## 2018-06-01 MED ORDER — SODIUM CHLORIDE 0.9 % IV SOLN
3.0000 g | Freq: Four times a day (QID) | INTRAVENOUS | Status: DC
  Filled 2018-06-01 (×3): qty 3

## 2018-06-01 MED ORDER — SODIUM CHLORIDE 0.9 % IV SOLN
1.0000 g | Freq: Three times a day (TID) | INTRAVENOUS | Status: DC
  Filled 2018-06-01: qty 1

## 2018-06-01 MED ORDER — HEPATITIS B VAC RECOMBINANT 10 MCG/ML IJ SUSP
1.0000 mL | Freq: Once | INTRAMUSCULAR | Status: DC
  Filled 2018-06-01 (×2): qty 1

## 2018-06-01 MED ORDER — SODIUM CHLORIDE 0.9 % IV SOLN
1.0000 g | Freq: Once | INTRAVENOUS | Status: DC
  Administered 2018-06-01: 17:00:00 1 g via INTRAVENOUS
  Filled 2018-06-01: qty 1

## 2018-06-01 MED ORDER — HEPATITIS B VAC RECOMBINANT 10 MCG/0.5ML IJ SUSP
0.5000 mL | Freq: Once | INTRAMUSCULAR | Status: AC
  Administered 2018-06-01: 0.5 mL via INTRAMUSCULAR
  Filled 2018-06-01: qty 0.5

## 2018-06-01 MED ORDER — IOHEXOL 300 MG/ML  SOLN
100.0000 mL | Freq: Once | INTRAMUSCULAR | Status: AC | PRN
  Administered 2018-06-01: 100 mL via INTRAVENOUS

## 2018-06-01 MED ORDER — HEPATITIS B VAC RECOMBINANT 10 MCG/ML IJ SUSP
10.0000 ug | Freq: Once | INTRAMUSCULAR | Status: DC
  Filled 2018-06-01: qty 1
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
## 2018-06-02 ENCOUNTER — Other Ambulatory Visit: Payer: Self-pay

## 2018-06-02 ENCOUNTER — Encounter
Admission: EM | Disposition: A | Discharge status: Home / Self Care | Payer: Self-pay | Source: Home / Self Care | Attending: Family Medicine

## 2018-06-02 ENCOUNTER — Inpatient Hospital Stay: Payer: Self-pay | Admitting: Anesthesiology

## 2018-06-02 ENCOUNTER — Encounter: Payer: Self-pay | Admitting: Anesthesiology

## 2018-06-02 DIAGNOSIS — Z978 Presence of other specified devices: Secondary | ICD-10-CM

## 2018-06-02 DIAGNOSIS — L089 Local infection of the skin and subcutaneous tissue, unspecified: Secondary | ICD-10-CM

## 2018-06-02 DIAGNOSIS — W503XXA Accidental bite by another person, initial encounter: Secondary | ICD-10-CM

## 2018-06-02 HISTORY — PX: I & D EXTREMITY: SHX5045

## 2018-06-02 LAB — GLUCOSE, CAPILLARY: Glucose-Capillary: 172 mg/dL — ABNORMAL HIGH (ref 70–99)

## 2018-06-02 LAB — CBC
HCT: 35.9 % — ABNORMAL LOW (ref 39.0–52.0)
Hemoglobin: 12.1 g/dL — ABNORMAL LOW (ref 13.0–17.0)
MCH: 31.6 pg (ref 26.0–34.0)
MCHC: 33.7 g/dL (ref 30.0–36.0)
MCV: 93.7 fL (ref 80.0–100.0)
Platelets: 223 10*3/uL (ref 150–400)
RBC: 3.83 MIL/uL — ABNORMAL LOW (ref 4.22–5.81)
RDW: 13.1 % (ref 11.5–15.5)
WBC: 12.1 10*3/uL — ABNORMAL HIGH (ref 4.0–10.5)
nRBC: 0 % (ref 0.0–0.2)

## 2018-06-02 LAB — BASIC METABOLIC PANEL
Anion gap: 7 (ref 5–15)
BUN: 11 mg/dL (ref 6–20)
CO2: 23 mmol/L (ref 22–32)
Calcium: 8.6 mg/dL — ABNORMAL LOW (ref 8.9–10.3)
Chloride: 105 mmol/L (ref 98–111)
Creatinine, Ser: 0.87 mg/dL (ref 0.61–1.24)
GFR calc Af Amer: >60 mL/min (ref >60)
GFR calc non Af Amer: >60 mL/min (ref >60)
Glucose, Bld: 116 mg/dL — ABNORMAL HIGH (ref 70–99)
Potassium: 3.9 mmol/L (ref 3.5–5.1)
Sodium: 135 mmol/L (ref 135–145)

## 2018-06-02 LAB — VANCOMYCIN, RANDOM: Vancomycin Rm: 9

## 2018-06-02 SURGERY — IRRIGATION AND DEBRIDEMENT EXTREMITY
Anesthesia: General | Site: Arm Lower | Laterality: Left

## 2018-06-02 MED ORDER — ONDANSETRON HCL 4 MG/2ML IJ SOLN
INTRAMUSCULAR | Status: DC | PRN
  Administered 2018-06-02: 4 mg via INTRAVENOUS

## 2018-06-02 MED ORDER — ONDANSETRON HCL 4 MG/2ML IJ SOLN
INTRAMUSCULAR | Status: AC
  Filled 2018-06-02: qty 2

## 2018-06-02 MED ORDER — PROPOFOL 10 MG/ML IV BOLUS
INTRAVENOUS | Status: AC
  Filled 2018-06-02: qty 20

## 2018-06-02 MED ORDER — PROPOFOL 10 MG/ML IV BOLUS
INTRAVENOUS | Status: DC | PRN
  Administered 2018-06-02: 170 mg via INTRAVENOUS
  Administered 2018-06-02: 30 mg via INTRAVENOUS

## 2018-06-02 MED ORDER — FENTANYL CITRATE (PF) 100 MCG/2ML IJ SOLN: 25.0000 ug | INTRAMUSCULAR | Status: DC | PRN

## 2018-06-02 MED ORDER — SODIUM CHLORIDE 0.9 % IV SOLN
INTRAVENOUS | Status: DC | PRN
  Administered 2018-06-02 – 2018-06-03 (×2): 250 mL via INTRAVENOUS

## 2018-06-02 MED ORDER — LIDOCAINE HCL (CARDIAC) PF 100 MG/5ML IV SOSY
PREFILLED_SYRINGE | INTRAVENOUS | Status: DC | PRN
  Administered 2018-06-02: 60 mg via INTRAVENOUS

## 2018-06-02 MED ORDER — OXYCODONE HCL 5 MG PO TABS: 5.0000 mg | ORAL_TABLET | Freq: Once | ORAL | Status: DC | PRN

## 2018-06-02 MED ORDER — HEPATITIS B VAC RECOMBINANT 10 MCG/0.5ML IJ SUSP
0.5000 mL | Freq: Once | INTRAMUSCULAR | Status: AC
  Administered 2018-06-02: 0.5 mL via INTRAMUSCULAR
  Filled 2018-06-02: qty 0.5

## 2018-06-02 MED ORDER — MIDAZOLAM HCL 2 MG/2ML IJ SOLN
INTRAMUSCULAR | Status: AC
  Filled 2018-06-02: qty 2

## 2018-06-02 MED ORDER — MIDAZOLAM HCL 2 MG/2ML IJ SOLN
INTRAMUSCULAR | Status: DC | PRN
  Administered 2018-06-02: 2 mg via INTRAVENOUS

## 2018-06-02 MED ORDER — DEXAMETHASONE SODIUM PHOSPHATE 10 MG/ML IJ SOLN
INTRAMUSCULAR | Status: AC
  Filled 2018-06-02: qty 1

## 2018-06-02 MED ORDER — LIDOCAINE HCL (PF) 2 % IJ SOLN
INTRAMUSCULAR | Status: AC
  Filled 2018-06-02: qty 10

## 2018-06-02 MED ORDER — FENTANYL CITRATE (PF) 100 MCG/2ML IJ SOLN
INTRAMUSCULAR | Status: DC | PRN
  Administered 2018-06-02 (×4): 50 ug via INTRAVENOUS

## 2018-06-02 MED ORDER — FENTANYL CITRATE (PF) 100 MCG/2ML IJ SOLN
INTRAMUSCULAR | Status: AC
  Filled 2018-06-02: qty 2

## 2018-06-02 MED ORDER — LACTATED RINGERS IV SOLN
INTRAVENOUS | Status: DC
  Administered 2018-06-02 – 2018-06-03 (×3): via INTRAVENOUS

## 2018-06-02 MED ORDER — OXYCODONE HCL 5 MG/5ML PO SOLN: 5.0000 mg | Freq: Once | ORAL | Status: DC | PRN

## 2018-06-02 SURGICAL SUPPLY — 24 items
BNDG CONFORM 2 STRL LF (GAUZE/BANDAGES/DRESSINGS) ×3 IMPLANT
CANISTER SUCT 1200ML W/VALVE (MISCELLANEOUS) ×3 IMPLANT
CHLORAPREP W/TINT 10.5 ML (MISCELLANEOUS) ×3 IMPLANT
COVER WAND RF STERILE (DRAPES) ×3 IMPLANT
CUFF TOURN SGL QUICK 24 (TOURNIQUET CUFF)
CUFF TOURN SGL QUICK 30 (TOURNIQUET CUFF)
CUFF TRNQT CYL 24X4X16.5-23 (TOURNIQUET CUFF) IMPLANT
CUFF TRNQT CYL 30X4X21-28X (TOURNIQUET CUFF) IMPLANT
ELECT REM PT RETURN 9FT ADLT (ELECTROSURGICAL) ×3
ELECTRODE REM PT RTRN 9FT ADLT (ELECTROSURGICAL) ×1 IMPLANT
GAUZE SPONGE 4X4 12PLY STRL (GAUZE/BANDAGES/DRESSINGS) ×3 IMPLANT
GAUZE XEROFORM 1X8 LF (GAUZE/BANDAGES/DRESSINGS) ×3 IMPLANT
GLOVE SURG SYN 9.0  PF PI (GLOVE) ×2
GLOVE SURG SYN 9.0 PF PI (GLOVE) ×1 IMPLANT
GOWN STRL REUS W/ TWL LRG LVL3 (GOWN DISPOSABLE) ×1 IMPLANT
GOWN STRL REUS W/TWL LRG LVL3 (GOWN DISPOSABLE) ×2
KIT TURNOVER KIT A (KITS) ×3 IMPLANT
NS IRRIG 500ML POUR BTL (IV SOLUTION) ×3 IMPLANT
PACK EXTREMITY ARMC (MISCELLANEOUS) ×3 IMPLANT
SCALPEL PROTECTED #15 DISP (BLADE) ×6 IMPLANT
STRAP SAFETY 5IN WIDE (MISCELLANEOUS) ×3 IMPLANT
SUT ETHILON 4-0 (SUTURE) ×2
SUT ETHILON 4-0 FS2 18XMFL BLK (SUTURE) ×1
SUTURE ETHLN 4-0 FS2 18XMF BLK (SUTURE) ×1 IMPLANT

## 2018-06-02 NOTE — Plan of Care (Signed)

## 2018-06-02 NOTE — Anesthesia Procedure Notes (Signed)
Procedure Name: LMA Insertion Date/Time: 06/02/2018 3:00 PM Performed by: Jonna Clark, CRNA Pre-anesthesia Checklist: Patient identified, Patient being monitored, Timeout performed, Emergency Drugs available and Suction available Patient Re-evaluated:Patient Re-evaluated prior to induction Oxygen Delivery Method: Circle system utilized Preoxygenation: Pre-oxygenation with 100% oxygen Induction Type: IV induction Ventilation: Mask ventilation without difficulty LMA: LMA inserted LMA Size: 4.0 Tube type: Oral Number of attempts: 1 Placement Confirmation: positive ETCO2 and breath sounds checked- equal and bilateral Tube secured with: Tape Dental Injury: Teeth and Oropharynx as per pre-operative assessment
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
## 2018-06-02 NOTE — Transfer of Care (Signed)
Immediate Anesthesia Transfer of Care Note  Patient: Hunter [name]  Procedure(s) Performed: IRRIGATION AND DEBRIDEMENT LEFT FOREARM AND WOUND VAC PLACEMENT (Left Arm Lower)  Patient Location: PACU  Anesthesia Type:General  Level of Consciousness: drowsy and patient cooperative  Airway & Oxygen Therapy: Patient Spontanous Breathing and Patient connected to face mask oxygen  Post-op Assessment: Report given to RN and Post -op Vital signs reviewed and stable  Post vital signs: Reviewed and stable  Last Vitals:  Vitals Value Taken Time  BP 123/78 06/02/2018  3:31 PM  Temp 36.8 C 06/02/2018  3:31 PM  Pulse 85 06/02/2018  3:36 PM  Resp 14 06/02/2018  3:36 PM  SpO2 100 % 06/02/2018  3:36 PM  Vitals shown include unvalidated device data.  Last Pain:  Vitals:   06/02/18 1531  TempSrc:   PainSc: 0-No pain      Patients Stated Pain Goal: 4 (45/99/77 4142)  Complications: No apparent anesthesia complications

## 2018-06-02 NOTE — Anesthesia Postprocedure Evaluation (Signed)
Anesthesia Post Note  Patient: Hunter [name]  Procedure(s) Performed: IRRIGATION AND DEBRIDEMENT LEFT FOREARM AND WOUND VAC PLACEMENT (Left Arm Lower)  Patient location during evaluation: PACU Anesthesia Type: General Level of consciousness: awake and alert Pain management: pain level controlled Vital Signs Assessment: post-procedure vital signs reviewed and stable Respiratory status: spontaneous breathing, nonlabored ventilation, respiratory function stable and patient connected to nasal cannula oxygen Cardiovascular status: blood pressure returned to baseline and stable Postop Assessment: no apparent nausea or vomiting Anesthetic complications: no     Last Vitals:  Vitals:   06/02/18 1546 06/02/18 1601  BP: 137/90 (!) 129/94  Pulse: 89 84  Resp: 19 13  Temp:  36.7 C  SpO2: 100% 97%    Last Pain:  Vitals:   06/02/18 1601  TempSrc:   PainSc: 0-No pain                 Martha Clan
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
## 2018-06-02 NOTE — Anesthesia Post-op Follow-up Note (Signed)
Anesthesia QCDR form completed.        
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
## 2018-06-02 NOTE — Progress Notes (Signed)
UPDATE:  Reviewed notes from Dr. Rudene Christians.  Surgery will sign off since orthopedics will be taking patient to OR.  Please call with additional questions or concerns

## 2018-06-03 ENCOUNTER — Encounter: Payer: Self-pay | Admitting: Orthopedic Surgery

## 2018-06-03 DIAGNOSIS — B9689 Other specified bacterial agents as the cause of diseases classified elsewhere: Secondary | ICD-10-CM

## 2018-06-03 LAB — BASIC METABOLIC PANEL
Anion gap: 7 (ref 5–15)
BUN: 15 mg/dL (ref 6–20)
CO2: 24 mmol/L (ref 22–32)
Calcium: 9 mg/dL (ref 8.9–10.3)
Chloride: 106 mmol/L (ref 98–111)
Creatinine, Ser: 0.9 mg/dL (ref 0.61–1.24)
GFR calc Af Amer: >60 mL/min (ref >60)
GFR calc non Af Amer: >60 mL/min (ref >60)
Glucose, Bld: 162 mg/dL — ABNORMAL HIGH (ref 70–99)
Potassium: 4.1 mmol/L (ref 3.5–5.1)
Sodium: 137 mmol/L (ref 135–145)

## 2018-06-03 LAB — HEPATITIS PANEL, ACUTE
HCV Ab: <0.1 s/co ratio (ref 0.0–0.9)
Hep A IgM: NEGATIVE
Hep B C IgM: NEGATIVE
Hepatitis B Surface Ag: NEGATIVE

## 2018-06-03 MED ORDER — NICOTINE 21 MG/24HR TD PT24: 21.0000 mg | MEDICATED_PATCH | Freq: Every day | TRANSDERMAL | 0 refills | Status: DC

## 2018-06-03 MED ORDER — HEPATITIS B IMMUNE GLOBULIN IM SOLN
0.0600 mL/kg | Freq: Once | INTRAMUSCULAR | Status: AC
  Administered 2018-06-03: 12:00:00 5.6 mL via INTRAMUSCULAR
  Filled 2018-06-03: qty 10

## 2018-06-03 MED ORDER — TRAMADOL HCL 50 MG PO TABS: 50.0000 mg | ORAL_TABLET | Freq: Four times a day (QID) | ORAL | 0 refills | Status: DC | PRN

## 2018-06-03 MED ORDER — AMOXICILLIN-POT CLAVULANATE 875-125 MG PO TABS: 1.0000 | ORAL_TABLET | Freq: Two times a day (BID) | ORAL | 0 refills | Status: DC
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
## 2018-06-03 NOTE — Progress Notes (Signed)
Minimal drainage. Has less forearm edema and much less pain post I+D.

## 2018-06-03 NOTE — Progress Notes (Signed)
DISCHARGE NOTE:  Pt given discharge instructions and scripts. Pt verbalized instructions. [facility] wound vac in place. Pt wheeled to car by staff.

## 2018-06-04 ENCOUNTER — Other Ambulatory Visit: Payer: Self-pay

## 2018-06-04 ENCOUNTER — Encounter: Payer: Self-pay | Admitting: Emergency Medicine

## 2018-06-04 ENCOUNTER — Emergency Department
Admission: EM | Admit: 2018-06-04 | Discharge: 2018-06-04 | Disposition: A | Discharge status: Home / Self Care | Payer: Self-pay | Attending: Emergency Medicine | Admitting: Emergency Medicine

## 2018-06-04 DIAGNOSIS — F17210 Nicotine dependence, cigarettes, uncomplicated: Secondary | ICD-10-CM | POA: Insufficient documentation

## 2018-06-04 DIAGNOSIS — Z4803 Encounter for change or removal of drains: Secondary | ICD-10-CM | POA: Insufficient documentation

## 2018-06-04 DIAGNOSIS — Z5189 Encounter for other specified aftercare: Secondary | ICD-10-CM

## 2018-06-04 NOTE — ED Triage Notes (Signed)
Pt arrived via POV with reports of wound vac problem. Pt states the dressing is not on correctly and the device is making a lot of noise and is not vacuuming correctly.  Pt denies any fevers or anything issue related to surgery. States he is here for dressing to be fixed.

## 2018-06-04 NOTE — ED Provider Notes (Signed)
[facility] EMERGENCY DEPARTMENT Provider Note   CSN: [id number] Arrival date & time: 06/04/18  1724    History   Chief Complaint Chief Complaint  Patient presents with  . Post-op Problem    Wound Vac Needs Re-Dressed    HPI Hunter [name] is a 53 y.o. male.  Presents to the emergency department for evaluation of Praveena negative pressure dressing evaluation.  Patient had a incision and drainage of the left forearm several days ago.  Praveena dressing placed and he states the Tegaderm is coming loose in the [facility] system is constantly sucking air.  He denies any increasing pain or swelling.  No warmth erythema or fevers.  Overall his forearm pain has improved significantly since incision and drainage procedure.     HPI  History reviewed. No pertinent past medical history.  Patient Active Problem List   Diagnosis Date Noted  . Cellulitis 06/01/2018  . Left arm cellulitis 05/30/2018    Past Surgical History:  Procedure Laterality Date  . I&D EXTREMITY Left 06/02/2018   Procedure: IRRIGATION AND DEBRIDEMENT LEFT FOREARM AND WOUND VAC PLACEMENT;  Surgeon: Hessie Knows, MD;  Location: [facility] ORS;  Service: Orthopedics;  Laterality: Left;        Home Medications    Prior to Admission medications   Medication Sig Start Date End Date Taking? Authorizing Provider  amoxicillin-clavulanate (AUGMENTIN) 875-125 MG tablet Take 1 tablet by mouth 2 (two) times daily. 06/03/18   Salary, Holly Bodily D, MD  aspirin EC 81 MG tablet Take 81 mg by mouth daily.    [provider]  nicotine (NICODERM CQ - DOSED IN MG/24 HOURS) 21 mg/24hr patch Place 1 patch (21 mg total) onto the skin daily. 06/04/18   Salary, Avel Peace, MD  traMADol (ULTRAM) 50 MG tablet Take 1 tablet (50 mg total) by mouth every 6 (six) hours as needed for moderate pain. 06/03/18   Salary, Avel Peace, MD    Family History Family History  Problem Relation Age of Onset  . Dementia Mother   . Cancer  Father   . COPD Father     Social History Social History   Tobacco Use  . Smoking status: Current Every Day Smoker    Packs/day: 1.00    Years: 30.00    Pack years: 30.00  . Smokeless tobacco: Never Used  Substance Use Topics  . Alcohol use: Yes    Comment: Socially   . Drug use: No     Allergies   Patient has no known allergies.   Review of Systems Review of Systems  Constitutional: Negative for fever.  Musculoskeletal: Negative for arthralgias.  Skin: Positive for wound. Negative for pallor and rash.     Physical Exam Updated Vital Signs BP (!) 160/99 (BP Location: Right Arm)   Pulse 84   Temp 98.5 F (36.9 C) (Oral)   Resp 18   Ht 5\' 9"  (1.753 m)   Wt 93 kg   SpO2 99%   BMI 30.27 kg/m   Physical Exam Constitutional:      Appearance: He is well-developed.  HENT:     Head: Normocephalic and atraumatic.  Eyes:     Conjunctiva/sclera: Conjunctivae normal.  Neck:     Musculoskeletal: Normal range of motion.  Cardiovascular:     Rate and Rhythm: Normal rate.  Pulmonary:     Effort: Pulmonary effort is normal. No respiratory distress.  Musculoskeletal: Normal range of motion.     Comments: Praveena negative pressure dressing  intact along the dorsal aspect of the left forearm.  Tegaderm has bubbled up and become loose with no drainage and the negative pressure dressing wound VAC system.  Forearm is soft.  Good range of motion the wrist and digits.  Praveena negative pressure dressing is reinforced with Tegaderm and an Ace wrap.  Tegaderm Ace wrap seem to fix leakage.  Patient stable ready for discharge.  Skin:    General: Skin is warm.     Findings: No rash.  Neurological:     Mental Status: He is alert and oriented to person, place, and time.  Psychiatric:        Behavior: Behavior normal.        Thought Content: Thought content normal.      ED Treatments / Results  Labs (all labs ordered are listed, but only abnormal results are displayed) Labs  Reviewed - No data to display  EKG None  Radiology No results found.  Procedures Procedures (including critical care time)  Medications Ordered in ED Medications - No data to display   Initial Impression / Assessment and Plan / ED Course  I have reviewed the triage vital signs and the nursing notes.  Pertinent labs & imaging results that were available during my care of the patient were reviewed by me and considered in my medical decision making (see chart for details).        53 year old male with Praveena negative pressure dressing to the left forearm.  The seal was broken and there was a leak in the system.  Seal was reinforced with Tegaderm and Ace wrap and patient stable and ready for discharge.  Overall patient's pain and swelling has significantly improved he states he is doing well from the incision and drainage.  No reason to completely remove wound VAC today.  Patient will follow-up with orthopedics.  Final Clinical Impressions(s) / ED Diagnoses   Final diagnoses:  Visit for wound check    ED Discharge Orders    None       Renata Caprice 06/04/18 2041    Harvest Dark, MD 06/04/18 2201

## 2018-06-04 NOTE — ED Notes (Signed)
Pt well known to [place], [facility] who is in assessing the patient and will redress the area with the wound vac. Pt denies other co's

## 2018-06-04 NOTE — Discharge Instructions (Addendum)
Please keep wound VAC clean and dry.  If any increasing pain, swelling or failure of the wound VAC system call the orthopedic office.

## 2018-06-06 LAB — CULTURE, BLOOD (ROUTINE X 2)
Culture: NO GROWTH
Culture: NO GROWTH
Special Requests: ADEQUATE
Special Requests: ADEQUATE

## 2018-06-08 LAB — AEROBIC/ANAEROBIC CULTURE W GRAM STAIN (SURGICAL/DEEP WOUND)

## 2018-06-21 ENCOUNTER — Inpatient Hospital Stay: Payer: Self-pay | Admitting: Infectious Diseases

## 2018-07-05 ENCOUNTER — Other Ambulatory Visit
Admission: RE | Admit: 2018-07-05 | Discharge: 2018-07-05 | Disposition: A | Discharge status: Home / Self Care | Payer: Self-pay | Source: Ambulatory Visit | Attending: Infectious Diseases | Admitting: Infectious Diseases

## 2018-07-05 ENCOUNTER — Ambulatory Visit: Payer: Self-pay | Attending: Infectious Diseases | Admitting: Infectious Diseases

## 2018-07-05 ENCOUNTER — Encounter: Payer: Self-pay | Admitting: Infectious Diseases

## 2018-07-05 ENCOUNTER — Other Ambulatory Visit: Payer: Self-pay

## 2018-07-05 VITALS — BP 152/96 | HR 88 | Temp 97.4°F | Ht 69.0 in | Wt 195.4 lb

## 2018-07-05 DIAGNOSIS — S51852D Open bite of left forearm, subsequent encounter: Secondary | ICD-10-CM | POA: Insufficient documentation

## 2018-07-05 DIAGNOSIS — W503XXD Accidental bite by another person, subsequent encounter: Secondary | ICD-10-CM

## 2018-07-05 DIAGNOSIS — Z23 Encounter for immunization: Secondary | ICD-10-CM

## 2018-07-05 DIAGNOSIS — W503XXA Accidental bite by another person, initial encounter: Secondary | ICD-10-CM | POA: Insufficient documentation

## 2018-07-05 NOTE — Addendum Note (Signed)
Addended by: Jarrett Ables D on: 07/05/2018 11:38 AM   Modules accepted: Orders
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
## 2018-07-06 LAB — HEPATITIS C ANTIBODY: HCV Ab: <0.1 s/co ratio (ref 0.0–0.9)

## 2018-07-06 LAB — HIV ANTIBODY (ROUTINE TESTING W REFLEX): HIV Screen 4th Generation wRfx: NONREACTIVE

## 2018-07-07 ENCOUNTER — Telehealth: Payer: Self-pay | Admitting: Licensed Clinical Social Worker

## 2018-07-07 NOTE — Telephone Encounter (Signed)
I called patient to let him know that his labs were normal and to f/u in 3 months

## 2018-07-29 ENCOUNTER — Other Ambulatory Visit: Payer: Self-pay

## 2018-07-29 DIAGNOSIS — Z20822 Contact with and (suspected) exposure to covid-19: Secondary | ICD-10-CM

## 2018-08-02 LAB — NOVEL CORONAVIRUS, NAA: SARS-CoV-2, NAA: NOT DETECTED

## 2018-08-04 ENCOUNTER — Telehealth: Payer: Self-pay

## 2018-08-04 NOTE — Telephone Encounter (Signed)
Patient called in requesting COVID19 test results - DOB verified - results given, no further questions.

## 2018-08-31 ENCOUNTER — Other Ambulatory Visit: Payer: Self-pay | Admitting: Internal Medicine

## 2018-08-31 DIAGNOSIS — Z20822 Contact with and (suspected) exposure to covid-19: Secondary | ICD-10-CM

## 2018-09-01 LAB — NOVEL CORONAVIRUS, NAA: SARS-CoV-2, NAA: NOT DETECTED

## 2018-09-01 LAB — SPECIMEN STATUS REPORT

## 2018-12-06 ENCOUNTER — Other Ambulatory Visit: Payer: Self-pay

## 2018-12-08 ENCOUNTER — Ambulatory Visit: Payer: Self-pay | Attending: Infectious Diseases

## 2018-12-08 ENCOUNTER — Other Ambulatory Visit: Payer: Self-pay

## 2018-12-08 DIAGNOSIS — Z23 Encounter for immunization: Secondary | ICD-10-CM

## 2019-02-27 ENCOUNTER — Ambulatory Visit: Payer: Self-pay

## 2020-03-11 ENCOUNTER — Encounter: Payer: Self-pay | Admitting: Gastroenterology

## 2020-03-14 ENCOUNTER — Telehealth (INDEPENDENT_AMBULATORY_CARE_PROVIDER_SITE_OTHER): Payer: Self-pay | Admitting: Gastroenterology

## 2020-03-14 ENCOUNTER — Other Ambulatory Visit: Payer: Self-pay

## 2020-03-14 DIAGNOSIS — Z1211 Encounter for screening for malignant neoplasm of colon: Secondary | ICD-10-CM

## 2020-03-14 MED ORDER — NA SULFATE-K SULFATE-MG SULF 17.5-3.13-1.6 GM/177ML PO SOLN: 1.0000 | Freq: Once | ORAL | 0 refills | Status: AC

## 2020-03-14 NOTE — Progress Notes (Signed)
Gastroenterology Pre-Procedure Review  Request Date: Tuesday 03/26/20 Requesting Physician: Dr. Vicente Males  PATIENT REVIEW QUESTIONS: The patient responded to the following health history questions as indicated:    1. Are you having any GI issues? no 2. Do you have a personal history of Polyps? no 3. Do you have a family history of Colon Cancer or Polyps? no 4. Diabetes Mellitus? no 5. Joint replacements in the past 12 months?no 6. Major health problems in the past 3 months?no 7. Any artificial heart valves, MVP, or defibrillator?no    MEDICATIONS & ALLERGIES:    Patient reports the following regarding taking any anticoagulation/antiplatelet therapy:   Plavix, Coumadin, Eliquis, Xarelto, Lovenox, Pradaxa, Brilinta, or Effient? no Aspirin? no  Patient confirms/reports the following medications:  Current Outpatient Medications  Medication Sig Dispense Refill  . aspirin EC 81 MG tablet Take 81 mg by mouth daily.    . Na Sulfate-K Sulfate-Mg Sulf 17.5-3.13-1.6 GM/177ML SOLN Take 1 kit by mouth once for 1 dose. 354 mL 0   No current facility-administered medications for this visit.    Patient confirms/reports the following allergies:  No Known Allergies  No orders of the defined types were placed in this encounter.   AUTHORIZATION INFORMATION Primary Insurance: 1D#: Group #:  Secondary Insurance: 1D#: Group #:  SCHEDULE INFORMATION: Date: Tuesday 03/26/20 Time: Location: [facility]

## 2020-03-21 ENCOUNTER — Other Ambulatory Visit: Admission: RE | Admit: 2020-03-21 | Payer: Self-pay | Source: Ambulatory Visit

## 2020-03-25 ENCOUNTER — Telehealth: Payer: Self-pay | Admitting: Gastroenterology

## 2020-03-25 ENCOUNTER — Other Ambulatory Visit
Admission: RE | Admit: 2020-03-25 | Discharge: 2020-03-25 | Disposition: A | Discharge status: Home / Self Care | Payer: Self-pay | Source: Ambulatory Visit | Attending: Gastroenterology | Admitting: Gastroenterology

## 2020-03-25 ENCOUNTER — Other Ambulatory Visit: Payer: Self-pay

## 2020-03-25 DIAGNOSIS — Z01812 Encounter for preprocedural laboratory examination: Secondary | ICD-10-CM | POA: Insufficient documentation

## 2020-03-25 DIAGNOSIS — Z20822 Contact with and (suspected) exposure to covid-19: Secondary | ICD-10-CM | POA: Insufficient documentation

## 2020-03-25 LAB — SARS CORONAVIRUS 2 (TAT 6-24 HRS): SARS Coronavirus 2: NEGATIVE

## 2020-03-25 NOTE — Telephone Encounter (Signed)
Patient called to ask for cheaper alternative for prep to be called into [id number] [name], [name] (Ph: [phone])  Procedure is scheduled for tomorrow.

## 2020-03-26 ENCOUNTER — Encounter
Admission: RE | Disposition: A | Discharge status: Home / Self Care | Payer: Self-pay | Source: Home / Self Care | Attending: Gastroenterology

## 2020-03-26 ENCOUNTER — Ambulatory Visit: Payer: Self-pay | Admitting: Anesthesiology

## 2020-03-26 ENCOUNTER — Ambulatory Visit
Admission: RE | Admit: 2020-03-26 | Discharge: 2020-03-26 | Disposition: A | Discharge status: Home / Self Care | Payer: Self-pay | Attending: Gastroenterology | Admitting: Gastroenterology

## 2020-03-26 ENCOUNTER — Encounter: Payer: Self-pay | Admitting: Gastroenterology

## 2020-03-26 ENCOUNTER — Other Ambulatory Visit: Payer: Self-pay

## 2020-03-26 DIAGNOSIS — K5730 Diverticulosis of large intestine without perforation or abscess without bleeding: Secondary | ICD-10-CM | POA: Insufficient documentation

## 2020-03-26 DIAGNOSIS — F17200 Nicotine dependence, unspecified, uncomplicated: Secondary | ICD-10-CM | POA: Insufficient documentation

## 2020-03-26 DIAGNOSIS — Z1211 Encounter for screening for malignant neoplasm of colon: Secondary | ICD-10-CM | POA: Insufficient documentation

## 2020-03-26 DIAGNOSIS — K635 Polyp of colon: Secondary | ICD-10-CM

## 2020-03-26 DIAGNOSIS — D123 Benign neoplasm of transverse colon: Secondary | ICD-10-CM | POA: Insufficient documentation

## 2020-03-26 DIAGNOSIS — Z825 Family history of asthma and other chronic lower respiratory diseases: Secondary | ICD-10-CM | POA: Insufficient documentation

## 2020-03-26 DIAGNOSIS — Z7982 Long term (current) use of aspirin: Secondary | ICD-10-CM | POA: Insufficient documentation

## 2020-03-26 DIAGNOSIS — Z809 Family history of malignant neoplasm, unspecified: Secondary | ICD-10-CM | POA: Insufficient documentation

## 2020-03-26 HISTORY — PX: COLONOSCOPY WITH PROPOFOL: SHX5780

## 2020-03-26 SURGERY — COLONOSCOPY WITH PROPOFOL
Anesthesia: General

## 2020-03-26 MED ORDER — PROPOFOL 10 MG/ML IV BOLUS
INTRAVENOUS | Status: DC | PRN
  Administered 2020-03-26: 20 mg via INTRAVENOUS
  Administered 2020-03-26: 50 mg via INTRAVENOUS

## 2020-03-26 MED ORDER — LIDOCAINE HCL (CARDIAC) PF 100 MG/5ML IV SOSY
PREFILLED_SYRINGE | INTRAVENOUS | Status: DC | PRN
  Administered 2020-03-26: 100 mg via INTRAVENOUS

## 2020-03-26 MED ORDER — PROPOFOL 500 MG/50ML IV EMUL
INTRAVENOUS | Status: DC | PRN
  Administered 2020-03-26: 165 ug/kg/min via INTRAVENOUS

## 2020-03-26 MED ORDER — SODIUM CHLORIDE 0.9 % IV SOLN: INTRAVENOUS | Status: DC

## 2020-03-26 MED ORDER — GLYCOPYRROLATE 0.2 MG/ML IJ SOLN
INTRAMUSCULAR | Status: DC | PRN
  Administered 2020-03-26: .2 mg via INTRAVENOUS

## 2020-03-26 NOTE — Anesthesia Preprocedure Evaluation (Signed)
Anesthesia Evaluation  Patient identified by MRN, date of birth, ID band Patient awake    Reviewed: Allergy & Precautions, H&P , NPO status , Patient's Chart, lab work & pertinent test results  History of Anesthesia Complications Negative for: history of anesthetic complications  Airway Mallampati: II  TM Distance: >3 FB     Dental  (+) Poor Dentition   Pulmonary neg sleep apnea, neg COPD, Current Smoker and Patient abstained from smoking.,    breath sounds clear to auscultation       Cardiovascular (-) angina(-) Past MI and (-) Cardiac Stents negative cardio ROS  (-) dysrhythmias  Rhythm:regular Rate:Normal     Neuro/Psych negative neurological ROS  negative psych ROS   GI/Hepatic negative GI ROS, Neg liver ROS,   Endo/Other  negative endocrine ROS  Renal/GU negative Renal ROS  negative genitourinary   Musculoskeletal   Abdominal   Peds  Hematology negative hematology ROS (+)   Anesthesia Other Findings History reviewed. No pertinent past medical history.  Past Surgical History: 06/02/2018: I & D EXTREMITY; Left     Comment:  Procedure: IRRIGATION AND DEBRIDEMENT LEFT FOREARM AND               WOUND VAC PLACEMENT;  Surgeon: Hessie Knows, MD;                Location: [facility] ORS;  Service: Orthopedics;  Laterality:               Left;  BMI    Body Mass Index: 28.94 kg/m      Reproductive/Obstetrics negative OB ROS                             Anesthesia Physical Anesthesia Plan  ASA: II  Anesthesia Plan: General   Post-op Pain Management:    Induction:   PONV Risk Score and Plan: Propofol infusion and TIVA  Airway Management Planned:   Additional Equipment:   Intra-op Plan:   Post-operative Plan:   Informed Consent: I have reviewed the patients History and Physical, chart, labs and discussed the procedure including the risks, benefits and alternatives for the  proposed anesthesia with the patient or authorized representative who has indicated his/her understanding and acceptance.     Dental Advisory Given  Plan Discussed with: Anesthesiologist, CRNA and Surgeon  Anesthesia Plan Comments:         Anesthesia Quick Evaluation

## 2020-03-26 NOTE — H&P (Signed)
° ° ° °  Jonathon Bellows, MD [address], [place], [place], [place], [address] [address], [place], [place], [place], [address] Phone: [phone]  Fax: [phone]  Primary Care Physician:  [facility]   Pre-Procedure History & Physical: HPI:  Hunter [name] is a 55 y.o. male is here for an colonoscopy.   History reviewed. No pertinent past medical history.  Past Surgical History:  Procedure Laterality Date   I & D EXTREMITY Left 06/02/2018   Procedure: IRRIGATION AND DEBRIDEMENT LEFT FOREARM AND WOUND VAC PLACEMENT;  Surgeon: Hessie Knows, MD;  Location: [facility] ORS;  Service: Orthopedics;  Laterality: Left;    Prior to Admission medications   Medication Sig Start Date End Date Taking? Authorizing Provider  aspirin EC 81 MG tablet Take 81 mg by mouth daily.   Yes [provider]    Allergies as of 03/14/2020   (No Known Allergies)    Family History  Problem Relation Age of Onset   Dementia Mother    Cancer Father    COPD Father     Social History   Socioeconomic History   Marital status: Married    Spouse name: Not on file   Number of children: Not on file   Years of education: Not on file   Highest education level: Not on file  Occupational History   Not on file  Tobacco Use   Smoking status: Current Every Day Smoker    Packs/day: 1.00    Years: 30.00    Pack years: 30.00   Smokeless tobacco: Never Used   Tobacco comment: smoked today 03/26/20  Substance and Sexual Activity   Alcohol use: Yes    Comment: Socially    Drug use: No   Sexual activity: Not on file  Other Topics Concern   Not on file  Social History Narrative   Not on file   Social Determinants of Health   Financial Resource Strain: Not on file  Food Insecurity: Not on file  Transportation Needs: Not on file  Physical Activity: Not on file  Stress: Not on file  Social Connections: Not on file  Intimate Partner Violence: Not on file     Review of Systems: See HPI, otherwise negative ROS  Physical Exam: BP (!) 140/106    Pulse 72    Temp (!) 96.9 F (36.1 C) (Temporal)    Resp 18    Ht 5\' 9"  (1.753 m)    Wt 88.9 kg    SpO2 100%    BMI 28.94 kg/m  General:   Alert,  pleasant and cooperative in NAD Head:  Normocephalic and atraumatic. Neck:  Supple; no masses or thyromegaly. Lungs:  Clear throughout to auscultation, normal respiratory effort.    Heart:  +S1, +S2, Regular rate and rhythm, No edema. Abdomen:  Soft, nontender and nondistended. Normal bowel sounds, without guarding, and without rebound.   Neurologic:  Alert and  oriented x4;  grossly normal neurologically.  Impression/Plan: Hunter [name] is here for an colonoscopy to be performed for Screening colonoscopy average risk   Risks, benefits, limitations, and alternatives regarding  colonoscopy have been reviewed with the patient.  Questions have been answered.  All parties agreeable.   Jonathon Bellows, MD  03/26/2020, 10:40 AM

## 2020-03-26 NOTE — Transfer of Care (Signed)
Immediate Anesthesia Transfer of Care Note  Patient: Hunter [name]  Procedure(s) Performed: COLONOSCOPY WITH PROPOFOL (N/A )  Patient Location: Endoscopy Unit  Anesthesia Type:General  Level of Consciousness: drowsy and patient cooperative  Airway & Oxygen Therapy: Patient Spontanous Breathing and Patient connected to face mask oxygen  Post-op Assessment: Report given to RN and Post -op Vital signs reviewed and stable  Post vital signs: Reviewed and stable  Last Vitals:  Vitals Value Taken Time  BP 117/79 03/26/20 1113  Temp 36.1 C 03/26/20 1112  Pulse 95 03/26/20 1115  Resp 20 03/26/20 1113  SpO2 100 % 03/26/20 1115  Vitals shown include unvalidated device data.  Last Pain:  Vitals:   03/26/20 1112  TempSrc: Temporal  PainSc: Asleep         Complications: No complications documented.

## 2020-03-26 NOTE — Anesthesia Procedure Notes (Signed)
Procedure Name: General with mask airway Performed by: Fletcher-Harrison, Loron Weimer, CRNA Pre-anesthesia Checklist: Patient identified, Emergency Drugs available, Suction available and Patient being monitored Patient Re-evaluated:Patient Re-evaluated prior to induction Oxygen Delivery Method: Simple face mask Induction Type: IV induction Placement Confirmation: positive ETCO2 and CO2 detector Dental Injury: Teeth and Oropharynx as per pre-operative assessment        

## 2020-03-26 NOTE — Op Note (Signed)
[facility] Gastroenterology Patient Name: Hunter [name] Procedure Date: 03/26/2020 10:40 AM MRN: [id number] Account #: [id number] Date of Birth: 1965/02/27 Admit Type: Outpatient Age: 55 Room: [facility] ENDO ROOM 2 Gender: Male Note Status: Finalized Procedure:             Colonoscopy Indications:           Screening for colorectal malignant neoplasm Providers:             Jonathon Bellows MD, MD Medicines:             Monitored Anesthesia Care Complications:         No immediate complications. Procedure:             Pre-Anesthesia Assessment:                        - Prior to the procedure, a History and Physical was                         performed, and patient medications, allergies and                         sensitivities were reviewed. The patient's tolerance                         of previous anesthesia was reviewed.                        - The risks and benefits of the procedure and the                         sedation options and risks were discussed with the                         patient. All questions were answered and informed                         consent was obtained.                        - ASA Grade Assessment: II - A patient with mild                         systemic disease.                        After obtaining informed consent, the colonoscope was                         passed under direct vision. Throughout the procedure,                         the patient's blood pressure, pulse, and oxygen                         saturations were monitored continuously. The                         Colonoscope was introduced through the anus and  advanced to the the cecum, identified by the                         appendiceal orifice. The colonoscopy was performed                         with ease. The patient tolerated the procedure well.                         The quality of the bowel preparation was excellent. Findings:      The  perianal and digital rectal examinations were normal.      Three sessile polyps were found in the transverse colon. The polyps were       4 to 5 mm in size. These polyps were removed with a cold snare.       Resection and retrieval were complete.      Multiple small-mouthed diverticula were found in the left colon.      The exam was otherwise without abnormality on direct and retroflexion       views. Impression:            - Three 4 to 5 mm polyps in the transverse colon,                         removed with a cold snare. Resected and retrieved.                        - Diverticulosis in the left colon.                        - The examination was otherwise normal on direct and                         retroflexion views. Recommendation:        - Discharge patient to home (with escort).                        - Resume previous diet.                        - Continue present medications.                        - Await pathology results.                        - Repeat colonoscopy for surveillance based on                         pathology results. Procedure Code(s):     --- Professional ---                        [phone], Colonoscopy, flexible; with removal of                         tumor(s), polyp(s), or other lesion(s) by snare                         technique Diagnosis Code(s):     --- Professional ---  Z12.11, Encounter for screening for malignant neoplasm                         of colon                        K63.5, Polyp of colon                        K57.30, Diverticulosis of large intestine without                         perforation or abscess without bleeding CPT copyright 2019 American Medical Association. All rights reserved. The codes documented in this report are preliminary and upon coder review may  be revised to meet current compliance requirements. Jonathon Bellows, MD Jonathon Bellows MD, MD 03/26/2020 11:11:57 AM This report has been signed  electronically. Number of Addenda: 0 Note Initiated On: 03/26/2020 10:40 AM Scope Withdrawal Time: 0 hours 15 minutes 51 seconds  Total Procedure Duration: 0 hours 17 minutes 5 seconds  Estimated Blood Loss:  Estimated blood loss: none.      [facility]

## 2020-03-27 ENCOUNTER — Encounter: Payer: Self-pay | Admitting: Gastroenterology

## 2020-03-27 LAB — SURGICAL PATHOLOGY

## 2020-03-28 ENCOUNTER — Encounter: Payer: Self-pay | Admitting: Gastroenterology

## 2020-03-28 NOTE — Anesthesia Postprocedure Evaluation (Signed)
Anesthesia Post Note  Patient: Hunter [name]  Procedure(s) Performed: COLONOSCOPY WITH PROPOFOL (N/A )  Patient location during evaluation: PACU Anesthesia Type: General Level of consciousness: awake and alert Pain management: pain level controlled Vital Signs Assessment: post-procedure vital signs reviewed and stable Respiratory status: spontaneous breathing, nonlabored ventilation and respiratory function stable Cardiovascular status: blood pressure returned to baseline and stable Postop Assessment: no apparent nausea or vomiting Anesthetic complications: no   No complications documented.   Last Vitals:  Vitals:   03/26/20 1112 03/26/20 1132  BP: 117/79 (!) 118/93  Pulse:    Resp: 20   Temp: (!) 36.1 C   SpO2: 99%     Last Pain:  Vitals:   03/27/20 0846  TempSrc:   PainSc: 0-No pain                 Tera Mater

## 2020-04-08 ENCOUNTER — Encounter: Payer: Self-pay | Admitting: Gastroenterology

## 2021-01-06 ENCOUNTER — Emergency Department
Admission: EM | Admit: 2021-01-06 | Discharge: 2021-01-06 | Disposition: A | Discharge status: Home / Self Care | Payer: Self-pay | Attending: Emergency Medicine | Admitting: Emergency Medicine

## 2021-01-06 ENCOUNTER — Emergency Department: Payer: Self-pay

## 2021-01-06 ENCOUNTER — Other Ambulatory Visit: Payer: Self-pay

## 2021-01-06 DIAGNOSIS — R1030 Lower abdominal pain, unspecified: Secondary | ICD-10-CM

## 2021-01-06 DIAGNOSIS — K5792 Diverticulitis of intestine, part unspecified, without perforation or abscess without bleeding: Secondary | ICD-10-CM | POA: Insufficient documentation

## 2021-01-06 LAB — URINALYSIS, ROUTINE W REFLEX MICROSCOPIC
Bilirubin Urine: NEGATIVE
Glucose, UA: NEGATIVE mg/dL
Hgb urine dipstick: NEGATIVE
Ketones, ur: NEGATIVE mg/dL
Leukocytes,Ua: NEGATIVE
Nitrite: NEGATIVE
Protein, ur: NEGATIVE mg/dL
Specific Gravity, Urine: 1.024 (ref 1.005–1.030)
pH: 5 (ref 5.0–8.0)

## 2021-01-06 LAB — CBC
HCT: 42.1 % (ref 39.0–52.0)
Hemoglobin: 13.9 g/dL (ref 13.0–17.0)
MCH: 31.9 pg (ref 26.0–34.0)
MCHC: 33 g/dL (ref 30.0–36.0)
MCV: 96.6 fL (ref 80.0–100.0)
Platelets: 264 10*3/uL (ref 150–400)
RBC: 4.36 MIL/uL (ref 4.22–5.81)
RDW: 13.2 % (ref 11.5–15.5)
WBC: 11.2 10*3/uL — ABNORMAL HIGH (ref 4.0–10.5)
nRBC: 0 % (ref 0.0–0.2)

## 2021-01-06 LAB — COMPREHENSIVE METABOLIC PANEL
ALT: 20 U/L (ref 0–44)
AST: 20 U/L (ref 15–41)
Albumin: 4.1 g/dL (ref 3.5–5.0)
Alkaline Phosphatase: 63 U/L (ref 38–126)
Anion gap: 6 (ref 5–15)
BUN: 13 mg/dL (ref 6–20)
CO2: 22 mmol/L (ref 22–32)
Calcium: 9 mg/dL (ref 8.9–10.3)
Chloride: 105 mmol/L (ref 98–111)
Creatinine, Ser: 0.75 mg/dL (ref 0.61–1.24)
GFR, Estimated: >60 mL/min (ref >60)
Glucose, Bld: 116 mg/dL — ABNORMAL HIGH (ref 70–99)
Potassium: 4 mmol/L (ref 3.5–5.1)
Sodium: 133 mmol/L — ABNORMAL LOW (ref 135–145)
Total Bilirubin: 0.5 mg/dL (ref 0.3–1.2)
Total Protein: 7.6 g/dL (ref 6.5–8.1)

## 2021-01-06 LAB — LIPASE, BLOOD: Lipase: 24 U/L (ref 11–51)

## 2021-01-06 MED ORDER — AMOXICILLIN-POT CLAVULANATE 875-125 MG PO TABS: 1.0000 | ORAL_TABLET | Freq: Two times a day (BID) | ORAL | 0 refills | Status: AC

## 2021-01-06 MED ORDER — SODIUM CHLORIDE 0.9 % IV BOLUS
1000.0000 mL | Freq: Once | INTRAVENOUS | Status: AC
  Administered 2021-01-06: 1000 mL via INTRAVENOUS

## 2021-01-06 MED ORDER — IOHEXOL 300 MG/ML  SOLN
100.0000 mL | Freq: Once | INTRAMUSCULAR | Status: AC | PRN
  Administered 2021-01-06: 100 mL via INTRAVENOUS
  Filled 2021-01-06: qty 100

## 2021-01-06 MED ORDER — OXYCODONE-ACETAMINOPHEN 5-325 MG PO TABS: 1.0000 | ORAL_TABLET | ORAL | 0 refills | Status: DC | PRN

## 2021-01-06 MED ORDER — ONDANSETRON HCL 4 MG/2ML IJ SOLN
4.0000 mg | Freq: Once | INTRAMUSCULAR | Status: AC
  Administered 2021-01-06: 4 mg via INTRAVENOUS
  Filled 2021-01-06: qty 2

## 2021-01-06 MED ORDER — MORPHINE SULFATE (PF) 4 MG/ML IV SOLN
4.0000 mg | Freq: Once | INTRAVENOUS | Status: AC
  Administered 2021-01-06: 4 mg via INTRAVENOUS
  Filled 2021-01-06: qty 1

## 2021-01-06 NOTE — ED Provider Notes (Addendum)
[facility] Provider Note    Event Date/Time   First MD Initiated Contact with Patient 01/06/21 1423     (approximate)  History   Chief Complaint: Abdominal Pain  HPI  RENALD HAITHCOCK is a 56 y.o. male with no past medical history besides alcohol use (2-3 beers per day) presents to the emergency department for lower abdominal pain.  According to the patient over the past several days he has been experiencing mild intermittent lower abdominal discomfort however since around 9:00 this morning it has been moderate to severe and constant.  Patient rates as a 9/10 aching type pain.  Denies any dysuria.  Denies any black or bloody stool.  Patient states small loose bowel movement this morning.  No nausea or vomiting.  Physical Exam   Triage Vital Signs: ED Triage Vitals  Enc Vitals Group     BP 01/06/21 1404 (!) 161/100     Pulse Rate 01/06/21 1404 87     Resp 01/06/21 1404 18     Temp 01/06/21 1404 99.1 F (37.3 C)     Temp src --      SpO2 01/06/21 1404 97 %     Weight --      Height --      Head Circumference --      Peak Flow --      Pain Score 01/06/21 1403 9     Pain Loc --      Pain Edu? --      Excl. in [place]? --     Most recent vital signs: Vitals:   01/06/21 1404  BP: (!) 161/100  Pulse: 87  Resp: 18  Temp: 99.1 F (37.3 C)  SpO2: 97%    General: Awake, no distress.  CV:  Good peripheral perfusion.  Regular rate and rhythm  Resp:  Normal effort.  Equal breath sounds bilaterally.  Abd:  No distention.  Soft, moderate lower abdominal tenderness to palpation with mild guarding.  No rebound.   ED Results / Procedures / Treatments   RADIOLOGY  CT scan read is pending.  I personally reviewed the CT images appear to show inflammation around either the distal small bowel or sigmoid colon.  Suspect likely diverticulitis.  Radiology read pending.   MEDICATIONS ORDERED IN ED: Medications - No data to display   IMPRESSION / MDM /  [place] / ED COURSE  I reviewed the triage vital signs and the nursing notes.  Patient presents emergency department for lower abdominal pain loose stool.  Patient has moderate tenderness to palpation across the lower abdomen.  Differential would include diverticulitis, colitis, appendicitis, UTI less likely pyelonephritis.  We will check labs including CBC, CMP, lipase and urine.  Will obtain CT imaging the abdomen/pelvis to further evaluate.  We will treat pain with morphine, nausea with Zofran and IV hydrate with 1 L of normal saline while awaiting results.  Patient agreeable to plan of care.  I reviewed the patient's records it appears that he had a colonoscopy performed by Dr. Vicente Males on 03/26/2020.  CT scan shows acute uncomplicated diverticulitis.  White blood cell count has elevated 11,200, remainder of the work-up including chemistry is reassuring, normal renal function.  Normal lipase.  We will place the patient on a short course of pain medication as well as Augmentin to cover for diverticulitis.  Patient agreeable to plan of care.  Discussed return precautions.  FINAL CLINICAL IMPRESSION(S) / ED DIAGNOSES   Lower abdominal  pain Diverticulitis  Discharge medications: Augmentin, Percocet  Note:  This document was prepared using Dragon voice recognition software and may include unintentional dictation errors.   Harvest Dark, MD 01/06/21 1510    Harvest Dark, MD 01/06/21 [phone]

## 2021-01-06 NOTE — ED Notes (Signed)
Lower abd pain that worsened this am.  Says had some on and off prior.

## 2021-01-06 NOTE — ED Triage Notes (Signed)
Pt comes with c/o abdominal pain for few days. Pt states 9/10 pain. Pt denies any N/V/D.  Pt does admit to drinking everyday. Pt denies any alcohol today. Pt states he had few beers yesterday.

## 2021-01-18 ENCOUNTER — Inpatient Hospital Stay
Admission: EM | Admit: 2021-01-18 | Discharge: 2021-02-01 | DRG: 330 | Disposition: A | Discharge status: Home Health | Payer: Self-pay | Attending: Surgery | Admitting: Surgery

## 2021-01-18 ENCOUNTER — Other Ambulatory Visit: Payer: Self-pay

## 2021-01-18 ENCOUNTER — Emergency Department: Payer: Self-pay

## 2021-01-18 DIAGNOSIS — Z20822 Contact with and (suspected) exposure to covid-19: Secondary | ICD-10-CM | POA: Diagnosis present

## 2021-01-18 DIAGNOSIS — Z825 Family history of asthma and other chronic lower respiratory diseases: Secondary | ICD-10-CM

## 2021-01-18 DIAGNOSIS — K668 Other specified disorders of peritoneum: Secondary | ICD-10-CM

## 2021-01-18 DIAGNOSIS — E871 Hypo-osmolality and hyponatremia: Secondary | ICD-10-CM | POA: Diagnosis not present

## 2021-01-18 DIAGNOSIS — Z79899 Other long term (current) drug therapy: Secondary | ICD-10-CM

## 2021-01-18 DIAGNOSIS — R509 Fever, unspecified: Secondary | ICD-10-CM

## 2021-01-18 DIAGNOSIS — F17210 Nicotine dependence, cigarettes, uncomplicated: Secondary | ICD-10-CM | POA: Diagnosis present

## 2021-01-18 DIAGNOSIS — Z7982 Long term (current) use of aspirin: Secondary | ICD-10-CM

## 2021-01-18 DIAGNOSIS — K5720 Diverticulitis of large intestine with perforation and abscess without bleeding: Principal | ICD-10-CM | POA: Diagnosis present

## 2021-01-18 DIAGNOSIS — K9189 Other postprocedural complications and disorders of digestive system: Secondary | ICD-10-CM | POA: Diagnosis not present

## 2021-01-18 DIAGNOSIS — Z452 Encounter for adjustment and management of vascular access device: Secondary | ICD-10-CM

## 2021-01-18 DIAGNOSIS — K5792 Diverticulitis of intestine, part unspecified, without perforation or abscess without bleeding: Secondary | ICD-10-CM

## 2021-01-18 DIAGNOSIS — K567 Ileus, unspecified: Secondary | ICD-10-CM | POA: Diagnosis not present

## 2021-01-18 LAB — COMPREHENSIVE METABOLIC PANEL
ALT: 31 U/L (ref 0–44)
AST: 23 U/L (ref 15–41)
Albumin: 4.3 g/dL (ref 3.5–5.0)
Alkaline Phosphatase: 58 U/L (ref 38–126)
Anion gap: 6 (ref 5–15)
BUN: 19 mg/dL (ref 6–20)
CO2: 21 mmol/L — ABNORMAL LOW (ref 22–32)
Calcium: 9.4 mg/dL (ref 8.9–10.3)
Chloride: 106 mmol/L (ref 98–111)
Creatinine, Ser: 0.84 mg/dL (ref 0.61–1.24)
GFR, Estimated: >60 mL/min (ref >60)
Glucose, Bld: 121 mg/dL — ABNORMAL HIGH (ref 70–99)
Potassium: 4.2 mmol/L (ref 3.5–5.1)
Sodium: 133 mmol/L — ABNORMAL LOW (ref 135–145)
Total Bilirubin: 0.8 mg/dL (ref 0.3–1.2)
Total Protein: 7.6 g/dL (ref 6.5–8.1)

## 2021-01-18 LAB — CBC WITH DIFFERENTIAL/PLATELET
Abs Immature Granulocytes: 0.08 10*3/uL — ABNORMAL HIGH (ref 0.00–0.07)
Basophils Absolute: 0 10*3/uL (ref 0.0–0.1)
Basophils Relative: 0 %
Eosinophils Absolute: 0 10*3/uL (ref 0.0–0.5)
Eosinophils Relative: 0 %
HCT: 40.9 % (ref 39.0–52.0)
Hemoglobin: 13.8 g/dL (ref 13.0–17.0)
Immature Granulocytes: 1 %
Lymphocytes Relative: 7 %
Lymphs Abs: 1.2 10*3/uL (ref 0.7–4.0)
MCH: 31.8 pg (ref 26.0–34.0)
MCHC: 33.7 g/dL (ref 30.0–36.0)
MCV: 94.2 fL (ref 80.0–100.0)
Monocytes Absolute: 0.6 10*3/uL (ref 0.1–1.0)
Monocytes Relative: 4 %
Neutro Abs: 14.2 10*3/uL — ABNORMAL HIGH (ref 1.7–7.7)
Neutrophils Relative %: 88 %
Platelets: 332 10*3/uL (ref 150–400)
RBC: 4.34 MIL/uL (ref 4.22–5.81)
RDW: 12.9 % (ref 11.5–15.5)
WBC: 16.1 10*3/uL — ABNORMAL HIGH (ref 4.0–10.5)
nRBC: 0 % (ref 0.0–0.2)

## 2021-01-18 LAB — RESP PANEL BY RT-PCR (FLU A&B, COVID) ARPGX2
Influenza A by PCR: NEGATIVE
Influenza B by PCR: NEGATIVE
SARS Coronavirus 2 by RT PCR: NEGATIVE

## 2021-01-18 LAB — LACTIC ACID, PLASMA
Lactic Acid, Venous: 1 mmol/L (ref 0.5–1.9)
Lactic Acid, Venous: 1.1 mmol/L (ref 0.5–1.9)

## 2021-01-18 MED ORDER — HYDROMORPHONE HCL 1 MG/ML IJ SOLN
0.5000 mg | Freq: Once | INTRAMUSCULAR | Status: AC
Start: 2021-01-18 — End: 2021-01-18
  Administered 2021-01-18: 0.5 mg via INTRAVENOUS
  Filled 2021-01-18: qty 1

## 2021-01-18 MED ORDER — PANTOPRAZOLE SODIUM 40 MG IV SOLR
40.0000 mg | Freq: Every day | INTRAVENOUS | Status: DC
  Administered 2021-01-18 – 2021-01-31 (×13): 40 mg via INTRAVENOUS
  Filled 2021-01-18 (×13): qty 40

## 2021-01-18 MED ORDER — KETOROLAC TROMETHAMINE 30 MG/ML IJ SOLN
30.0000 mg | Freq: Four times a day (QID) | INTRAMUSCULAR | Status: AC
  Administered 2021-01-18 – 2021-01-23 (×20): 30 mg via INTRAVENOUS
  Filled 2021-01-18 (×20): qty 1

## 2021-01-18 MED ORDER — POLYETHYLENE GLYCOL 3350 17 G PO PACK: 17.0000 g | PACK | Freq: Every day | ORAL | Status: DC | PRN

## 2021-01-18 MED ORDER — HYDROMORPHONE HCL 1 MG/ML IJ SOLN: 0.5000 mg | INTRAMUSCULAR | Status: DC | PRN

## 2021-01-18 MED ORDER — ACETAMINOPHEN 500 MG PO TABS
1000.0000 mg | ORAL_TABLET | Freq: Four times a day (QID) | ORAL | Status: DC
  Administered 2021-01-19 – 2021-01-23 (×16): 1000 mg via ORAL
  Filled 2021-01-18 (×19): qty 2

## 2021-01-18 MED ORDER — ONDANSETRON HCL 4 MG/2ML IJ SOLN
4.0000 mg | Freq: Four times a day (QID) | INTRAMUSCULAR | Status: DC | PRN
  Administered 2021-01-24 – 2021-01-28 (×2): 4 mg via INTRAVENOUS
  Filled 2021-01-18 (×2): qty 2

## 2021-01-18 MED ORDER — SODIUM CHLORIDE 0.9 % IV SOLN
12.5000 mg | Freq: Four times a day (QID) | INTRAVENOUS | Status: DC | PRN
  Filled 2021-01-18 (×2): qty 0.5

## 2021-01-18 MED ORDER — IOHEXOL 300 MG/ML  SOLN
100.0000 mL | Freq: Once | INTRAMUSCULAR | Status: AC | PRN
  Administered 2021-01-18: 100 mL via INTRAVENOUS

## 2021-01-18 MED ORDER — LACTATED RINGERS IV SOLN
125.0000 mL/h | INTRAVENOUS | Status: DC
  Administered 2021-01-18 – 2021-01-20 (×6): 125 mL/h via INTRAVENOUS

## 2021-01-18 MED ORDER — MORPHINE SULFATE (PF) 4 MG/ML IV SOLN
4.0000 mg | Freq: Once | INTRAVENOUS | Status: AC
  Administered 2021-01-18: 4 mg via INTRAVENOUS
  Filled 2021-01-18: qty 1

## 2021-01-18 MED ORDER — PIPERACILLIN-TAZOBACTAM 3.375 G IVPB
3.3750 g | Freq: Three times a day (TID) | INTRAVENOUS | Status: DC
  Administered 2021-01-19 – 2021-01-23 (×13): 3.375 g via INTRAVENOUS
  Filled 2021-01-18 (×13): qty 50

## 2021-01-18 MED ORDER — HYDROMORPHONE HCL 1 MG/ML IJ SOLN
0.5000 mg | INTRAMUSCULAR | Status: DC | PRN
  Administered 2021-01-18 – 2021-01-19 (×4): 0.5 mg via INTRAVENOUS
  Filled 2021-01-18 (×4): qty 1

## 2021-01-18 MED ORDER — ONDANSETRON HCL 4 MG/2ML IJ SOLN
4.0000 mg | Freq: Once | INTRAMUSCULAR | Status: AC
  Administered 2021-01-18: 4 mg via INTRAVENOUS
  Filled 2021-01-18: qty 2

## 2021-01-18 MED ORDER — ENOXAPARIN SODIUM 40 MG/0.4ML IJ SOSY
40.0000 mg | PREFILLED_SYRINGE | INTRAMUSCULAR | Status: DC
  Administered 2021-01-19 – 2021-01-20 (×2): 40 mg via SUBCUTANEOUS
  Filled 2021-01-18 (×5): qty 0.4

## 2021-01-18 MED ORDER — SODIUM CHLORIDE 0.9 % IV BOLUS
1000.0000 mL | Freq: Once | INTRAVENOUS | Status: AC
  Administered 2021-01-18: 1000 mL via INTRAVENOUS

## 2021-01-18 MED ORDER — ONDANSETRON 4 MG PO TBDP: 4.0000 mg | ORAL_TABLET | Freq: Four times a day (QID) | ORAL | Status: DC | PRN

## 2021-01-18 MED ORDER — PIPERACILLIN-TAZOBACTAM 3.375 G IVPB 30 MIN
3.3750 g | Freq: Once | INTRAVENOUS | Status: AC
  Administered 2021-01-18: 3.375 g via INTRAVENOUS
  Filled 2021-01-18: qty 50

## 2021-01-18 MED ORDER — HYDROMORPHONE HCL 1 MG/ML IJ SOLN
1.0000 mg | Freq: Once | INTRAMUSCULAR | Status: DC
Start: 2021-01-18 — End: 2021-01-18

## 2021-01-18 NOTE — ED Triage Notes (Signed)
Pt states he was diagnosed with diverticulitis and was given medication for it- pt finished his meds 2 days ago and is back with the same symptoms- pt is mostly complaining of lower abdominal pain- pt is concerned the diverticulitis is back

## 2021-01-18 NOTE — ED Provider Notes (Signed)
[facility] Provider Note    Event Date/Time   First MD Initiated Contact with Patient 01/18/21 1814     (approximate)   History   Abdominal Pain   HPI  Hunter [name] is a 56 y.o. male otherwise healthy not on medications who comes in with concerns for abdominal pain.  Patient reports being seen here in the emergency room on 01/06/2021.  On review of records patient was diagnosed with diverticulitis.  He was discharged on a course of Augmentin.  He reports finishing these 2 days ago and had started to feel better but then developed worsening pain in the past day.  Pain is on the left side severe.   Physical Exam   Triage Vital Signs: ED Triage Vitals [01/18/21 1607]  Enc Vitals Group     BP 140/87     Pulse Rate 81     Resp 18     Temp 98.6 F (37 C)     Temp Source Oral     SpO2 97 %     Weight 200 lb (90.7 kg)     Height 5\' 9"  (1.753 m)     Head Circumference      Peak Flow      Pain Score 10     Pain Loc      Pain Edu?      Excl. in [place]?     Most recent vital signs: Vitals:   01/18/21 1607  BP: 140/87  Pulse: 81  Resp: 18  Temp: 98.6 F (37 C)  SpO2: 97%     General: Awake,  Appears uncomfortable CV:  Good peripheral perfusion.  Normal rate Resp:  Normal effort.  Abd:  No distention.  Patient is tender in the left lower quadrant with some mild guarding but not significantly tender in other areas of his abdomen.    ED Results / Procedures / Treatments   Labs (all labs ordered are listed, but only abnormal results are displayed) Labs Reviewed  CBC WITH DIFFERENTIAL/PLATELET - Abnormal; Notable for the following components:      Result Value   WBC 16.1 (*)    Neutro Abs 14.2 (*)    Abs Immature Granulocytes 0.08 (*)    All other components within normal limits  COMPREHENSIVE METABOLIC PANEL - Abnormal; Notable for the following components:   Sodium 133 (*)    CO2 21 (*)    Glucose, Bld 121 (*)    All other components  within normal limits  RESP PANEL BY RT-PCR (FLU A&B, COVID) ARPGX2  CULTURE, BLOOD (ROUTINE X 2)  CULTURE, BLOOD (ROUTINE X 2)  LACTIC ACID, PLASMA  LACTIC ACID, PLASMA     RADIOLOGY I have reviewed the xray personally and agree with radiology read with CT concerning for diverticulitis with possible microperforations abscess   PROCEDURES:  Critical Care performed: No   .1-3 Lead EKG Interpretation Performed by: Vanessa [place], MD Authorized by: Vanessa [place], MD     Interpretation: normal     ECG rate:  80s   ECG rate assessment: normal     Rhythm: sinus rhythm     Ectopy: none     Conduction: normal     MEDICATIONS ORDERED IN ED: Medications  piperacillin-tazobactam (ZOSYN) IVPB 3.375 g (has no administration in time range)  sodium chloride 0.9 % bolus 1,000 mL (has no administration in time range)  HYDROmorphone (DILAUDID) injection 0.5 mg (has no administration in time range)  morphine  4 MG/ML injection 4 mg (4 mg Intravenous Given 01/18/21 1620)  ondansetron (ZOFRAN) injection 4 mg (4 mg Intravenous Given 01/18/21 1620)  iohexol (OMNIPAQUE) 300 MG/ML solution 100 mL (100 mLs Intravenous Contrast Given 01/18/21 1710)     IMPRESSION / MDM / ASSESSMENT AND PLAN / ED COURSE  I reviewed the triage vital signs and the nursing notes.  Patient otherwise healthy who comes in with concerns for left lower quadrant pain in the setting of recently treated diverticulitis.  This is concerning for possible rupture, abscess, microperforation.  Also concerning for Electra abnormalities, AKI.  Patient's white count is trending up from 12 days ago from 11 now to 16 with a left neutrophil shift.  His sodium is stable at 133.  Patient appears very uncomfortable we will start some IV fluids, IV Dilaudid.  CT imaging concerning for possible abscess versus microperforation.  I will start on some IV Zosyn to help prevent deterioration.  I discussed with surgery Dr. Hampton Abbot who came down to see  the patient who will admit patient  The patient is on the cardiac monitor to evaluate for evidence of arrhythmia and/or significant heart rate changes.    FINAL CLINICAL IMPRESSION(S) / ED DIAGNOSES   Final diagnoses:  Diverticulitis of large intestine with perforation and abscess without bleeding     Rx / DC Orders   ED Discharge Orders     None        Note:  This document was prepared using Dragon voice recognition software and may include unintentional dictation errors.   Vanessa [place], MD 01/18/21 [phone]

## 2021-01-18 NOTE — ED Provider Triage Note (Signed)
Emergency Medicine Provider Triage Evaluation Note  Hunter [name] , a 56 y.o. male  was evaluated in triage.  Pt complains of patient reports to the emergency room with complaint of generalized abdominal pain that is worse in the left lower quadrant.  Patient reports that last week he was in the emergency room and treated for diverticulitis.  He completed full course of medication 2 days ago and this a.m. started with same symptoms again.  He reports that the pain is severe sharp stabbing in nature.  The pain is centralized in the middle of his abdomen, but most severe in the left lower quadrant.  Patient's abdomen is tender to touch with guarding..  Review of Systems  Positive: Is positive for abdominal pain, tenderness and nausea. Negative: Patient has been negative for vomiting, diarrhea, fever.  Physical Exam  There were no vitals taken for this visit. Gen:   Awake, appears to be in significant amount of abdominal pain. Resp:  Normal effort  MSK:   Moves extremities without difficulty  Other:  Patient has significant amount of abdominal pain located worse in the left lower quadrant.  Medical Decision Making  Medically screening exam initiated at 4:06 PM.  Appropriate orders placed.  [name] was informed that the remainder of the evaluation will be completed by another provider, this initial triage assessment does not replace that evaluation, and the importance of remaining in the ED until their evaluation is complete.  Will obtain baseline labs and CT of abdomen to evaluate for diverticulitis return. Will have nurse to start IV and administer pain medication while awaiting room.   Willaim Rayas, NP 01/18/21 [phone]

## 2021-01-18 NOTE — ED Provider Notes (Signed)
Radiologist called to speak with this provider about findings from CT of abdomen.  Radiologist was concerned that CT could show early signs of abscess or possible small rupture of colon.  Patient also positive for diverticulitis though less concerning than prior.  This was discussed with Dr. Nickolas Madrid.  Patient will be placed in next available room and Dr. Nickolas Madrid will admit.   Willaim Rayas, NP 01/18/21 1757    Vanessa Wingo, MD 01/18/21 Valerie Roys

## 2021-01-18 NOTE — H&P (Signed)
Date of Admission:  01/18/2021  Reason for Admission:  Acute diverticulitis with perforation  History of Present Illness: Hunter [name] is a 56 y.o. male presenting with worsening abdominal pain x 1 day.  He was seen in the ED on 01/06/21 for the same and was diagnosed with uncomplicated acute diverticulitis.  He was given a course of Augmentin and discharged home.  The patient reports that he completed the antibiotics and was feeling much better at the end of the course.  Antibiotics were done about 2 days ago.  The patient reports that today he went to work and started having some mild discomfort which progressively got worse throughout the day and presented to emergency room for further evaluation.  Denies any fevers but reports having chills on the way to the hospital.  Denies any vomiting but reports some mild nausea.  His pain is localized to the left lower quadrant and reports that it is not diffuse but he is very tender.  On work-up in the emergency room, his white blood cell count is more elevated compared to previous and it is 16.1 today.  Renal function remains normal at 0.84.  He had a CT scan of the abdomen pelvis showing again inflammation of the sigmoid colon with foci of air consistent with microperforation.  There is no free air.  The patient's vital signs are stable.  Patient denies any prior abdominal surgeries.  He drinks 2-3 beers per day but no other medical problems.  Past Medical History: History reviewed. No pertinent past medical history.   Past Surgical History: Past Surgical History:  Procedure Laterality Date   COLONOSCOPY WITH PROPOFOL N/A 03/26/2020   Procedure: COLONOSCOPY WITH PROPOFOL;  Surgeon: Jonathon Bellows, MD;  Location: [facility] ENDOSCOPY;  Service: Gastroenterology;  Laterality: N/A;   I & D EXTREMITY Left 06/02/2018   Procedure: IRRIGATION AND DEBRIDEMENT LEFT FOREARM AND WOUND VAC PLACEMENT;  Surgeon: Hessie Knows, MD;  Location: [facility] ORS;  Service: Orthopedics;   Laterality: Left;    Home Medications: Prior to Admission medications   Medication Sig Start Date End Date Taking? Authorizing Provider  aspirin EC 81 MG tablet Take 81 mg by mouth daily.    [provider]  oxyCODONE-acetaminophen (PERCOCET) 5-325 MG tablet Take 1 tablet by mouth every 4 (four) hours as needed for severe pain. 01/06/21   Harvest Dark, MD    Allergies: No Known Allergies  Social History:  reports that he has been smoking cigarettes. He has a 30.00 pack-year smoking history. He has never used smokeless tobacco. He reports current alcohol use. He reports current drug use. Drug: Methamphetamines.   Family History: Family History  Problem Relation Age of Onset   Dementia Mother    Cancer Father    COPD Father     Review of Systems: Review of Systems  Constitutional:  Positive for chills. Negative for fever.  HENT:  Negative for hearing loss.   Respiratory:  Negative for shortness of breath.   Cardiovascular:  Negative for chest pain.  Gastrointestinal:  Positive for abdominal pain and nausea. Negative for constipation, diarrhea and vomiting.  Genitourinary:  Negative for dysuria.  Musculoskeletal:  Negative for myalgias.  Skin:  Negative for rash.  Neurological:  Negative for dizziness.  Psychiatric/Behavioral:  Negative for depression.    Physical Exam BP 140/87    Pulse 81    Temp 98.6 F (37 C) (Oral)    Resp 18    Ht 5\' 9"  (1.753 m)  Wt 90.7 kg    SpO2 97%    BMI 29.53 kg/m  CONSTITUTIONAL: Patient appears to be in pain. HEENT:  Normocephalic, atraumatic, extraocular motion intact. NECK: Trachea is midline, and there is no jugular venous distension.  RESPIRATORY:  Normal respiratory effort without pathologic use of accessory muscles. CARDIOVASCULAR: Regular rhythm and rate. GI: The abdomen is soft, nondistended, but very tender to palpation in the left lower quadrant in a very localized point and not diffuse.  MUSCULOSKELETAL:  Normal  muscle strength and tone in all four extremities.  No peripheral edema or cyanosis. SKIN: Skin turgor is normal. There are no pathologic skin lesions.  NEUROLOGIC:  Motor and sensation is grossly normal.  Cranial nerves are grossly intact. PSYCH:  Alert and oriented to person, place and time. Affect is normal.  Laboratory Analysis: Results for orders placed or performed during the hospital encounter of 01/18/21 (from the past 24 hour(s))  CBC with Differential     Status: Abnormal   Collection Time: 01/18/21  4:16 PM  Result Value Ref Range   WBC 16.1 (H) 4.0 - 10.5 K/uL   RBC 4.34 4.22 - 5.81 MIL/uL   Hemoglobin 13.8 13.0 - 17.0 g/dL   HCT 40.9 39.0 - 52.0 %   MCV 94.2 80.0 - 100.0 fL   MCH 31.8 26.0 - 34.0 pg   MCHC 33.7 30.0 - 36.0 g/dL   RDW 12.9 11.5 - 15.5 %   Platelets 332 150 - 400 K/uL   nRBC 0.0 0.0 - 0.2 %   Neutrophils Relative % 88 %   Neutro Abs 14.2 (H) 1.7 - 7.7 K/uL   Lymphocytes Relative 7 %   Lymphs Abs 1.2 0.7 - 4.0 K/uL   Monocytes Relative 4 %   Monocytes Absolute 0.6 0.1 - 1.0 K/uL   Eosinophils Relative 0 %   Eosinophils Absolute 0.0 0.0 - 0.5 K/uL   Basophils Relative 0 %   Basophils Absolute 0.0 0.0 - 0.1 K/uL   Immature Granulocytes 1 %   Abs Immature Granulocytes 0.08 (H) 0.00 - 0.07 K/uL  Comprehensive metabolic panel     Status: Abnormal   Collection Time: 01/18/21  4:16 PM  Result Value Ref Range   Sodium 133 (L) 135 - 145 mmol/L   Potassium 4.2 3.5 - 5.1 mmol/L   Chloride 106 98 - 111 mmol/L   CO2 21 (L) 22 - 32 mmol/L   Glucose, Bld 121 (H) 70 - 99 mg/dL   BUN 19 6 - 20 mg/dL   Creatinine, Ser 0.84 0.61 - 1.24 mg/dL   Calcium 9.4 8.9 - 10.3 mg/dL   Total Protein 7.6 6.5 - 8.1 g/dL   Albumin 4.3 3.5 - 5.0 g/dL   AST 23 15 - 41 U/L   ALT 31 0 - 44 U/L   Alkaline Phosphatase 58 38 - 126 U/L   Total Bilirubin 0.8 0.3 - 1.2 mg/dL   GFR, Estimated >60 >60 mL/min   Anion gap 6 5 - 15    Imaging: CT ABDOMEN PELVIS W CONTRAST  Addendum  Date: 01/18/2021   ADDENDUM REPORT: 01/18/2021 18:11 ADDENDUM: These results were called by telephone at the time of interpretation on 01/18/2021 at 5:48 p.m. to provider [facility] , who verbally acknowledged these results. Electronically Signed   By: Fidela Salisbury M.D.   On: 01/18/2021 18:11   Result Date: 01/18/2021 CLINICAL DATA:  Acute abdominal pain. EXAM: CT ABDOMEN AND PELVIS WITH CONTRAST TECHNIQUE: Multidetector CT imaging of the  abdomen and pelvis was performed using the standard protocol following bolus administration of intravenous contrast. RADIATION DOSE REDUCTION: This exam was performed according to the departmental dose-optimization program which includes automated exposure control, adjustment of the mA and/or kV according to patient size and/or use of iterative reconstruction technique. CONTRAST:  152mL OMNIPAQUE IOHEXOL 300 MG/ML  SOLN COMPARISON:  January 06, 2021 FINDINGS: Lower chest: No acute abnormality. Hepatobiliary: No focal liver abnormality is seen. No gallstones, gallbladder wall thickening, or biliary dilatation. Pancreas: Unremarkable. No pancreatic ductal dilatation or surrounding inflammatory changes. Spleen: Normal in size without focal abnormality. Adrenals/Urinary Tract: Adrenal glands are unremarkable. Kidneys are normal, without renal calculi, focal lesion, or hydronephrosis. Bladder is unremarkable. Stomach/Bowel: Normal appearance of the stomach and small bowel. Persistent changes of proximal sigmoid diverticulitis. Several small gas collections within an area of pericolonic fat stranding may represent early abscess formation or focal micro rupture. The sigmoid colon itself is less edematous than on the prior study. Vascular/Lymphatic: Aortic atherosclerosis. No enlarged abdominal or pelvic lymph nodes. Reproductive: Prostate is unremarkable. Other: No abdominal wall hernia or abnormality. No abdominopelvic ascites. Musculoskeletal: No acute or significant osseous  findings. IMPRESSION: 1. Persistent changes of proximal sigmoid diverticulitis. Several small gas collections within an area of pericolonic fat stranding may represent early abscess formation or focal sigmoid colon micro rupture. The sigmoid colon itself is less edematous than on the prior study. 2. Aortic atherosclerosis. Aortic Atherosclerosis (ICD10-I70.0). Electronically Signed: By: Fidela Salisbury M.D. On: 01/18/2021 17:43    Assessment and Plan: This is a 56 y.o. male with perforated diverticulitis.  - Discussed with the patient the CT scan findings.  I personally viewed the images.  Patient has foci of air consistent with microperforation but no free air.  Discussed with the patient that although the antibiotic initially may have helped, he now has complicated diverticulitis due to the microperforation.  I continue to be appropriate to send him home so I will be admitting him to the surgical team.  He is very tender to palpation but is very localized and not diffuse and given that there is no free air, discussed with the patient that we will attempt to treat him conservatively for now with IV antibiotics, n.p.o. diet, IV fluids, and appropriate pain control.  However, cannot guarantee him that this will be enough as he is in quite amount of pain.  If there is no improvement or worsening tomorrow morning, then we will decide on proceeding with surgery.  I spent 70 minutes dedicated to the care of this patient on the date of this encounter to include pre-visit review of records, face-to-face time with the patient discussing diagnosis and management, and any post-visit coordination of care.   Melvyn Neth, MD [facility] Pg:  [phone]

## 2021-01-18 NOTE — ED Notes (Signed)
Messaged NP regarding request for additional antinausea medication- pt still extremely nauseated and having some dry heaving post Zofran. Abdominal pain improved some after Toradol.

## 2021-01-19 ENCOUNTER — Encounter: Payer: Self-pay | Admitting: Surgery

## 2021-01-19 DIAGNOSIS — K5720 Diverticulitis of large intestine with perforation and abscess without bleeding: Secondary | ICD-10-CM | POA: Insufficient documentation

## 2021-01-19 LAB — CBC WITH DIFFERENTIAL/PLATELET
Abs Immature Granulocytes: 0.22 10*3/uL — ABNORMAL HIGH (ref 0.00–0.07)
Basophils Absolute: 0.1 10*3/uL (ref 0.0–0.1)
Basophils Relative: 0 %
Eosinophils Absolute: 0 10*3/uL (ref 0.0–0.5)
Eosinophils Relative: 0 %
HCT: 35.9 % — ABNORMAL LOW (ref 39.0–52.0)
Hemoglobin: 12 g/dL — ABNORMAL LOW (ref 13.0–17.0)
Immature Granulocytes: 1 %
Lymphocytes Relative: 4 %
Lymphs Abs: 1.1 10*3/uL (ref 0.7–4.0)
MCH: 31.4 pg (ref 26.0–34.0)
MCHC: 33.4 g/dL (ref 30.0–36.0)
MCV: 94 fL (ref 80.0–100.0)
Monocytes Absolute: 0.6 10*3/uL (ref 0.1–1.0)
Monocytes Relative: 2 %
Neutro Abs: 23.9 10*3/uL — ABNORMAL HIGH (ref 1.7–7.7)
Neutrophils Relative %: 93 %
Platelets: 287 10*3/uL (ref 150–400)
RBC: 3.82 MIL/uL — ABNORMAL LOW (ref 4.22–5.81)
RDW: 13.2 % (ref 11.5–15.5)
Smear Review: NORMAL
WBC: 25.9 10*3/uL — ABNORMAL HIGH (ref 4.0–10.5)
nRBC: 0 % (ref 0.0–0.2)

## 2021-01-19 LAB — BLOOD CULTURE ID PANEL (REFLEXED) - BCID2

## 2021-01-19 LAB — BASIC METABOLIC PANEL
Anion gap: 4 — ABNORMAL LOW (ref 5–15)
BUN: 15 mg/dL (ref 6–20)
CO2: 23 mmol/L (ref 22–32)
Calcium: 8.6 mg/dL — ABNORMAL LOW (ref 8.9–10.3)
Chloride: 105 mmol/L (ref 98–111)
Creatinine, Ser: 0.89 mg/dL (ref 0.61–1.24)
GFR, Estimated: >60 mL/min (ref >60)
Glucose, Bld: 110 mg/dL — ABNORMAL HIGH (ref 70–99)
Potassium: 3.8 mmol/L (ref 3.5–5.1)
Sodium: 132 mmol/L — ABNORMAL LOW (ref 135–145)

## 2021-01-19 LAB — MAGNESIUM: Magnesium: 2.1 mg/dL (ref 1.7–2.4)

## 2021-01-19 LAB — HIV ANTIBODY (ROUTINE TESTING W REFLEX): HIV Screen 4th Generation wRfx: NONREACTIVE

## 2021-01-19 MED ORDER — HYDROMORPHONE HCL 1 MG/ML IJ SOLN
0.5000 mg | INTRAMUSCULAR | Status: DC | PRN
  Administered 2021-01-19 – 2021-01-20 (×3): 0.5 mg via INTRAVENOUS
  Filled 2021-01-19: qty 0.5
  Filled 2021-01-19: qty 1
  Filled 2021-01-19: qty 0.5

## 2021-01-19 NOTE — ED Notes (Signed)
Lab called for cultures.

## 2021-01-19 NOTE — Progress Notes (Signed)
01/19/2021  Subjective: No acute events overnight.  This morning, upon entering the patient's room, she appears more comfortable compared to yesterday.  Yesterday he was very much in pain.  This morning he reports that he is having pain but the medication is keeping it under control.  He does appear more comfortable was able to have a better conversation with me and not bending over like he was yesterday.  However his white blood cell count is more elevated to 25.9 this morning.  He did have a low-grade temp yesterday, but otherwise his, vital signs have been stable, without any tachycardia or hypotension.  Vital signs: Temp:  [98.6 F (37 C)-100.2 F (37.9 C)] 98.9 F (37.2 C) (01/15 0811) Pulse Rate:  [81-99] 99 (01/15 0811) Resp:  [17-20] 17 (01/15 0811) BP: (126-152)/(81-87) 126/81 (01/15 0811) SpO2:  [96 %-99 %] 96 % (01/15 0811) Weight:  [90.7 kg] 90.7 kg (01/14 1607)   Intake/Output: 01/14 0701 - 01/15 0700 In: 1000 [IV Piggyback:1000] Out: 400 [Urine:400]    Physical Exam: Constitutional: No acute distress Abdomen: Soft, nondistended, with improved tenderness to palpation.  Today I am able to press better on his left lower quadrant compared to yesterday.  No diffuse peritonitis.  Labs:  Recent Labs    01/18/21 1616 01/19/21 0709  WBC 16.1* 25.9*  HGB 13.8 12.0*  HCT 40.9 35.9*  PLT 332 287   Recent Labs    01/18/21 1616 01/19/21 0709  NA 133* 132*  K 4.2 3.8  CL 106 105  CO2 21* 23  GLUCOSE 121* 110*  BUN 19 15  CREATININE 0.84 0.89  CALCIUM 9.4 8.6*   No results for input(s): LABPROT, INR in the last 72 hours.  Imaging: CT ABDOMEN PELVIS W CONTRAST  Addendum Date: 01/18/2021   ADDENDUM REPORT: 01/18/2021 18:11 ADDENDUM: These results were called by telephone at the time of interpretation on 01/18/2021 at 5:48 p.m. to provider [facility] , who verbally acknowledged these results. Electronically Signed   By: Fidela Salisbury M.D.   On: 01/18/2021  18:11   Result Date: 01/18/2021 CLINICAL DATA:  Acute abdominal pain. EXAM: CT ABDOMEN AND PELVIS WITH CONTRAST TECHNIQUE: Multidetector CT imaging of the abdomen and pelvis was performed using the standard protocol following bolus administration of intravenous contrast. RADIATION DOSE REDUCTION: This exam was performed according to the departmental dose-optimization program which includes automated exposure control, adjustment of the mA and/or kV according to patient size and/or use of iterative reconstruction technique. CONTRAST:  1100mL OMNIPAQUE IOHEXOL 300 MG/ML  SOLN COMPARISON:  January 06, 2021 FINDINGS: Lower chest: No acute abnormality. Hepatobiliary: No focal liver abnormality is seen. No gallstones, gallbladder wall thickening, or biliary dilatation. Pancreas: Unremarkable. No pancreatic ductal dilatation or surrounding inflammatory changes. Spleen: Normal in size without focal abnormality. Adrenals/Urinary Tract: Adrenal glands are unremarkable. Kidneys are normal, without renal calculi, focal lesion, or hydronephrosis. Bladder is unremarkable. Stomach/Bowel: Normal appearance of the stomach and small bowel. Persistent changes of proximal sigmoid diverticulitis. Several small gas collections within an area of pericolonic fat stranding may represent early abscess formation or focal micro rupture. The sigmoid colon itself is less edematous than on the prior study. Vascular/Lymphatic: Aortic atherosclerosis. No enlarged abdominal or pelvic lymph nodes. Reproductive: Prostate is unremarkable. Other: No abdominal wall hernia or abnormality. No abdominopelvic ascites. Musculoskeletal: No acute or significant osseous findings. IMPRESSION: 1. Persistent changes of proximal sigmoid diverticulitis. Several small gas collections within an area of pericolonic fat stranding may represent early abscess formation  or focal sigmoid colon micro rupture. The sigmoid colon itself is less edematous than on the prior study.  2. Aortic atherosclerosis. Aortic Atherosclerosis (ICD10-I70.0). Electronically Signed: By: Fidela Salisbury M.D. On: 01/18/2021 17:43    Assessment/Plan: This is a 56 y.o. male with acute diverticulitis with microperforation with foci of air.  - The patient's white blood cell count did go up this morning significantly but clinically, the patient does appear to be better.  She reports that the pain medications are helping but he does feel that when the medication wears off it is less intense compared to yesterday.  Discussed with him that he does look comfortable in perhaps may not need to rush through surgery today but want to make sure that the pain medication is not masking his pain.  As a precaution, I will change the frequency doses from every 2 hours to every 3 hours as needed and will keep the Toradol going.  Discussed with him that if there is any worsening in his pain, then we would need to discuss further about doing a Hartman's procedure. -Continue n.p.o. today, continue IV fluids, continue IV antibiotics, continue pain control. - He may be out of bed and ambulate as tolerated.  I spent 25 minutes dedicated to the care of this patient on the date of this encounter to include pre-visit review of records, face-to-face time with the patient discussing diagnosis and management, and any post-visit coordination of care.  Melvyn Neth, [facility]

## 2021-01-19 NOTE — ED Notes (Signed)
Ice chips given to pt, ok per Dr. Hampton Abbot.

## 2021-01-19 NOTE — Progress Notes (Signed)
PHARMACY - PHYSICIAN COMMUNICATION CRITICAL VALUE ALERT - BLOOD CULTURE IDENTIFICATION (BCID)  Hunter [name] is an 56 y.o. male who presented to [facility] on 01/18/2021 with a chief complaint of lower abdominal pain. CT scan shows acute uncomplicated diverticulitis.  Assessment:  BCID 1/4 S epidermidis  Mec A/C detected  Name of physician (or Provider) ContactedHampton Abbot, MD  Current antibiotics: Zosyn 3.375 grams IV every 8 hours  Changes to prescribed antibiotics recommended: none Considered a contaminant at this point   Results for orders placed or performed during the hospital encounter of 01/18/21  Blood Culture ID Panel (Reflexed) (Collected: 01/18/2021  6:40 PM)  Result Value Ref Range   Enterococcus faecalis NOT DETECTED NOT DETECTED   Enterococcus Faecium NOT DETECTED NOT DETECTED   Listeria monocytogenes NOT DETECTED NOT DETECTED   Staphylococcus species DETECTED (A) NOT DETECTED   Staphylococcus aureus (BCID) NOT DETECTED NOT DETECTED   Staphylococcus epidermidis DETECTED (A) NOT DETECTED   Staphylococcus lugdunensis NOT DETECTED NOT DETECTED   Streptococcus species NOT DETECTED NOT DETECTED   Streptococcus agalactiae NOT DETECTED NOT DETECTED   Streptococcus pneumoniae NOT DETECTED NOT DETECTED   Streptococcus pyogenes NOT DETECTED NOT DETECTED   A.calcoaceticus-baumannii NOT DETECTED NOT DETECTED   Bacteroides fragilis NOT DETECTED NOT DETECTED   Enterobacterales NOT DETECTED NOT DETECTED   Enterobacter cloacae complex NOT DETECTED NOT DETECTED   Escherichia coli NOT DETECTED NOT DETECTED   Klebsiella aerogenes NOT DETECTED NOT DETECTED   Klebsiella oxytoca NOT DETECTED NOT DETECTED   Klebsiella pneumoniae NOT DETECTED NOT DETECTED   Proteus species NOT DETECTED NOT DETECTED   Salmonella species NOT DETECTED NOT DETECTED   Serratia marcescens NOT DETECTED NOT DETECTED   Haemophilus influenzae NOT DETECTED NOT DETECTED   Neisseria meningitidis NOT DETECTED NOT  DETECTED   Pseudomonas aeruginosa NOT DETECTED NOT DETECTED   Stenotrophomonas maltophilia NOT DETECTED NOT DETECTED   Candida albicans NOT DETECTED NOT DETECTED   Candida auris NOT DETECTED NOT DETECTED   Candida glabrata NOT DETECTED NOT DETECTED   Candida krusei NOT DETECTED NOT DETECTED   Candida parapsilosis NOT DETECTED NOT DETECTED   Candida tropicalis NOT DETECTED NOT DETECTED   Cryptococcus neoformans/gattii NOT DETECTED NOT DETECTED   Methicillin resistance mecA/C DETECTED (A) NOT DETECTED    Dallie Piles 01/19/2021  1:06 PM

## 2021-01-19 NOTE — ED Notes (Signed)
RN at bedside. Pt c/o unchanged pain and nausea. RN will administer PRN orders. Pt provided emesis bag at this time

## 2021-01-19 NOTE — ED Notes (Signed)
Surgeon at bedside.  

## 2021-01-20 LAB — CBC
HCT: 33.4 % — ABNORMAL LOW (ref 39.0–52.0)
Hemoglobin: 11.2 g/dL — ABNORMAL LOW (ref 13.0–17.0)
MCH: 31.6 pg (ref 26.0–34.0)
MCHC: 33.5 g/dL (ref 30.0–36.0)
MCV: 94.4 fL (ref 80.0–100.0)
Platelets: 260 10*3/uL (ref 150–400)
RBC: 3.54 MIL/uL — ABNORMAL LOW (ref 4.22–5.81)
RDW: 13.2 % (ref 11.5–15.5)
WBC: 14 10*3/uL — ABNORMAL HIGH (ref 4.0–10.5)
nRBC: 0 % (ref 0.0–0.2)

## 2021-01-20 LAB — BASIC METABOLIC PANEL
Anion gap: 6 (ref 5–15)
BUN: 13 mg/dL (ref 6–20)
CO2: 25 mmol/L (ref 22–32)
Calcium: 8.8 mg/dL — ABNORMAL LOW (ref 8.9–10.3)
Chloride: 105 mmol/L (ref 98–111)
Creatinine, Ser: 0.97 mg/dL (ref 0.61–1.24)
GFR, Estimated: >60 mL/min (ref >60)
Glucose, Bld: 80 mg/dL (ref 70–99)
Potassium: 3.7 mmol/L (ref 3.5–5.1)
Sodium: 136 mmol/L (ref 135–145)

## 2021-01-20 LAB — MAGNESIUM: Magnesium: 2.2 mg/dL (ref 1.7–2.4)

## 2021-01-20 MED ORDER — OXYCODONE HCL 5 MG PO TABS
5.0000 mg | ORAL_TABLET | ORAL | Status: DC | PRN
  Administered 2021-01-20 – 2021-01-23 (×4): 10 mg via ORAL
  Filled 2021-01-20 (×4): qty 2

## 2021-01-20 MED ORDER — SODIUM CHLORIDE 0.9 % IV SOLN: INTRAVENOUS | Status: DC | PRN

## 2021-01-20 MED ORDER — KCL IN DEXTROSE-NACL 20-5-0.45 MEQ/L-%-% IV SOLN
INTRAVENOUS | Status: DC
  Filled 2021-01-20 (×2): qty 1000

## 2021-01-20 MED ORDER — HYDROMORPHONE HCL 1 MG/ML IJ SOLN
0.5000 mg | INTRAMUSCULAR | Status: DC | PRN
Start: 2021-01-20 — End: 2021-01-24
  Administered 2021-01-20 – 2021-01-24 (×12): 0.5 mg via INTRAVENOUS
  Filled 2021-01-20 (×12): qty 0.5

## 2021-01-20 NOTE — Plan of Care (Signed)

## 2021-01-20 NOTE — Progress Notes (Signed)
[facility] SURGICAL PROGRESS NOTE (cpt [id number])  Hospital Day(s): 2.   Interval History: Patient seen and examined, no acute events or new complaints overnight. Patient reports that his abdominal pain is improving but still needing pain medications. No fever, chills, nausea, emesis. He does endorse feeling hungry. Leukocytosis is improving this morning; down to 14.0K. Renal function is normal; sCr - 0.97; UO is unmeasured. No electrolyte derangements. He has been NPO. He continues on Zosyn.    Review of Systems:  Constitutional: denies fever, chills  HEENT: denies cough or congestion  Respiratory: denies any shortness of breath  Cardiovascular: denies chest pain or palpitations  Gastrointestinal: + abdominal pain (improved), denied  N/V Genitourinary: denies burning with urination or urinary frequency Musculoskeletal: denies pain, decreased motor or sensation  Vital signs in last 24 hours: [min-max] current  Temp:  [98.2 F (36.8 C)-98.9 F (37.2 C)] 98.3 F (36.8 C) (01/16 0532) Pulse Rate:  [78-99] 93 (01/16 0532) Resp:  [16-19] 18 (01/16 0532) BP: (113-146)/(75-98) 132/88 (01/16 0532) SpO2:  [96 %-99 %] 98 % (01/16 0532)     Height: 5\' 9"  (175.3 cm) Weight: 90.7 kg BMI (Calculated): 29.52   Intake/Output last 2 shifts:  01/15 0701 - 01/16 0700 In: 3616.3 [I.V.:3421.8; IV Piggyback:194.5] Out: -    Physical Exam:  Constitutional: alert, cooperative and no distress  HENT: normocephalic without obvious abnormality  Eyes: PERRL, EOM's grossly intact and symmetric  Respiratory: breathing non-labored at rest  Cardiovascular: regular rate and sinus rhythm  Gastrointestinal: Soft, LLQ tenderness is significantly improved, non-distended, no rebound/guarding. He is without evidence of peritonitis  Musculoskeletal: no edema or wounds, motor and sensation grossly intact, NT    Labs:  CBC Latest Ref Rng & Units 01/20/2021 01/19/2021 01/18/2021  WBC 4.0 - 10.5 K/uL  14.0(H) 25.9(H) 16.1(H)  Hemoglobin 13.0 - 17.0 g/dL 11.2(L) 12.0(L) 13.8  Hematocrit 39.0 - 52.0 % 33.4(L) 35.9(L) 40.9  Platelets 150 - 400 K/uL 260 287 332   CMP Latest Ref Rng & Units 01/20/2021 01/19/2021 01/18/2021  Glucose 70 - 99 mg/dL 80 110(H) 121(H)  BUN 6 - 20 mg/dL 13 15 19   Creatinine 0.61 - 1.24 mg/dL 0.97 0.89 0.84  Sodium 135 - 145 mmol/L 136 132(L) 133(L)  Potassium 3.5 - 5.1 mmol/L 3.7 3.8 4.2  Chloride 98 - 111 mmol/L 105 105 106  CO2 22 - 32 mmol/L 25 23 21(L)  Calcium 8.9 - 10.3 mg/dL 8.8(L) 8.6(L) 9.4  Total Protein 6.5 - 8.1 g/dL - - 7.6  Total Bilirubin 0.3 - 1.2 mg/dL - - 0.8  Alkaline Phos 38 - 126 U/L - - 58  AST 15 - 41 U/L - - 23  ALT 0 - 44 U/L - - 31     Imaging studies: No new pertinent imaging studies   Assessment/Plan: (ICD-10's: K13.92) 56 y.o. male with now improving leukocytosis and abdominal pain admitted with complicated diverticulitis with perforation   - Okay to start CLD  - Continue IVF Resuscitation  - Continue IV Abx (Zosyn)  - No emergent surgical intervention; He understands that should he fail conservative measures or deteriorate, we would need to proceed with temporizing colectomy.   - Monitor abdominal examination; on-going bowel function   - Pain control prn; antiemetics prn   - Okay to ambulate as tolerated   All of the above findings and recommendations were discussed with the patient, patient's family at bedside, and the medical team, and all of patient's and family's questions were answered  to their expressed satisfaction.  -- Edison Simon, PA-C [facility] 01/20/2021, 7:32 AM [phone] M-F: 7am - 4pm

## 2021-01-21 LAB — CBC
HCT: 32.5 % — ABNORMAL LOW (ref 39.0–52.0)
Hemoglobin: 11 g/dL — ABNORMAL LOW (ref 13.0–17.0)
MCH: 31.7 pg (ref 26.0–34.0)
MCHC: 33.8 g/dL (ref 30.0–36.0)
MCV: 93.7 fL (ref 80.0–100.0)
Platelets: 244 10*3/uL (ref 150–400)
RBC: 3.47 MIL/uL — ABNORMAL LOW (ref 4.22–5.81)
RDW: 13.2 % (ref 11.5–15.5)
WBC: 11.7 10*3/uL — ABNORMAL HIGH (ref 4.0–10.5)
nRBC: 0 % (ref 0.0–0.2)

## 2021-01-21 NOTE — Progress Notes (Signed)
[name] SURGICAL PROGRESS NOTE (cpt [phone])  Hospital Day(s): 3.   Interval History: Patient seen and examined, no acute events or new complaints overnight. Patient reports he is feeling better this morning. Still with very mild (if any)LLQ soreness, much improved from presentation. He denies fever, chills, nausea, emesis. His leukocytosis continues to make improvements; down to 11.7K this morning. Hgb stable at 114.0; likely dilutional. He has been on CLD and tolerating well. He is passing flatus and awaiting BM.   Review of Systems:  Constitutional: denies fever, chills  HEENT: denies cough or congestion  Respiratory: denies any shortness of breath  Cardiovascular: denies chest pain or palpitations  Gastrointestinal: + abdominal pain (minimal), denied  N/V Genitourinary: denies burning with urination or urinary frequency Musculoskeletal: denies pain, decreased motor or sensation  Vital signs in last 24 hours: [min-max] current  Temp:  [98.2 F (36.8 C)-99.9 F (37.7 C)] 98.5 F (36.9 C) (01/17 0401) Pulse Rate:  [77-107] 77 (01/17 0401) Resp:  [16-20] 20 (01/17 0401) BP: (119-139)/(80-89) 119/80 (01/17 0401) SpO2:  [94 %-100 %] 97 % (01/17 0401)     Height: 5\' 9"  (175.3 cm) Weight: 90.7 kg BMI (Calculated): 29.52   Intake/Output last 2 shifts:  01/16 0701 - 01/17 0700 In: 720 [P.O.:720] Out: -    Physical Exam:  Constitutional: alert, cooperative and no distress  HENT: normocephalic without obvious abnormality  Eyes: PERRL, EOM's grossly intact and symmetric  Respiratory: breathing non-labored at rest  Cardiovascular: regular rate and sinus rhythm  Gastrointestinal: Soft, very mild (if any) LLQ soreness, non-distended, no rebound/guarding. He is without evidence of peritonitis  Musculoskeletal: no edema or wounds, motor and sensation grossly intact, NT    Labs:  CBC Latest Ref Rng & Units 01/21/2021 01/20/2021 01/19/2021  WBC 4.0 - 10.5 K/uL 11.7(H)  14.0(H) 25.9(H)  Hemoglobin 13.0 - 17.0 g/dL 11.0(L) 11.2(L) 12.0(L)  Hematocrit 39.0 - 52.0 % 32.5(L) 33.4(L) 35.9(L)  Platelets 150 - 400 K/uL 244 260 287   CMP Latest Ref Rng & Units 01/20/2021 01/19/2021 01/18/2021  Glucose 70 - 99 mg/dL 80 110(H) 121(H)  BUN 6 - 20 mg/dL 13 15 19   Creatinine 0.61 - 1.24 mg/dL 0.97 0.89 0.84  Sodium 135 - 145 mmol/L 136 132(L) 133(L)  Potassium 3.5 - 5.1 mmol/L 3.7 3.8 4.2  Chloride 98 - 111 mmol/L 105 105 106  CO2 22 - 32 mmol/L 25 23 21(L)  Calcium 8.9 - 10.3 mg/dL 8.8(L) 8.6(L) 9.4  Total Protein 6.5 - 8.1 g/dL - - 7.6  Total Bilirubin 0.3 - 1.2 mg/dL - - 0.8  Alkaline Phos 38 - 126 U/L - - 58  AST 15 - 41 U/L - - 23  ALT 0 - 44 U/L - - 31     Imaging studies: No new pertinent imaging studies   Assessment/Plan: (ICD-10's: K61.92) 56 y.o. male with improving leukocytosis and abdominal pain admitted with complicated diverticulitis with perforation               - Will advance to full liquid diet; will hold off on further advancement until tomorrow              - Discontinue VF Resuscitation             - Continue IV Abx (Zosyn); he will certainly need PO for home   - Monitor leukocytosis; improving              - No emergent surgical intervention; He understands  that should he fail conservative measures or deteriorate, we would need to proceed with temporizing colectomy.   - Pending clinical condition; may need repeat imaging; I think we can continue to hold off on this for now given continued improvements in last 48 hours              - Monitor abdominal examination; on-going bowel function              - Pain control prn; antiemetics prn              - Okay to ambulate as tolerated    - Discharge Planning: Potentially home in next 24-48 hours pending clinical condition  All of the above findings and recommendations were discussed with the patient, and the medical team, and all of patient's questions were answered to his expressed  satisfaction.  -- Hunter Simon, PA-C [name] 01/21/2021, 7:33 AM [phone] M-F: 7am - 4pm
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
## 2021-01-22 ENCOUNTER — Inpatient Hospital Stay: Payer: Self-pay

## 2021-01-22 LAB — CBC
HCT: 32.9 % — ABNORMAL LOW (ref 39.0–52.0)
Hemoglobin: 11.2 g/dL — ABNORMAL LOW (ref 13.0–17.0)
MCH: 31.9 pg (ref 26.0–34.0)
MCHC: 34 g/dL (ref 30.0–36.0)
MCV: 93.7 fL (ref 80.0–100.0)
Platelets: 272 10*3/uL (ref 150–400)
RBC: 3.51 MIL/uL — ABNORMAL LOW (ref 4.22–5.81)
RDW: 13.2 % (ref 11.5–15.5)
WBC: 7.5 10*3/uL (ref 4.0–10.5)
nRBC: 0 % (ref 0.0–0.2)

## 2021-01-22 MED ORDER — KCL IN DEXTROSE-NACL 20-5-0.45 MEQ/L-%-% IV SOLN
INTRAVENOUS | Status: DC
  Filled 2021-01-22 (×3): qty 1000

## 2021-01-22 MED ORDER — IOHEXOL 300 MG/ML  SOLN
100.0000 mL | Freq: Once | INTRAMUSCULAR | Status: AC | PRN
  Administered 2021-01-22: 100 mL via INTRAVENOUS

## 2021-01-22 MED ORDER — IOHEXOL 9 MG/ML PO SOLN
500.0000 mL | ORAL | Status: AC
  Administered 2021-01-22 (×2): 500 mL via ORAL

## 2021-01-22 NOTE — Progress Notes (Signed)
01/22/21, 7:19 pm  Came to assess patient and to discuss imaging studies from today.  CT scan shows increased free air compared to initial CT scan from 1/14.  The sigmoid colon itself appears less inflamed, and there's an area in left mid abdomen of fluid that may be developing into abscess.  Attempted repeat CT after to evaluate for contrast extravasation, but contrast had not reached the area of diverticulitis yet.    Discussed with patient and potential options.  Have contacted [facility] but there are no beds and they are closed for transfers for medicine and surgery.  Overall it is unclear if the free air may have been accumulation from his initial portion of the hospitalization vs new free air that's developed today.  Clinically he is doing well, with normal WBC, no fevers, normal blood pressure and heart rate, and appears more comfortable compared to initial presentation.  Discussed with him that I would want to keep him NPO tonight as a precaution given the findings and reassess tomorrow.  Tomorrow, depending how he's doing, may need to proceed with surgery in form of laparoscopic washout but may need Hartmann's given the free air.  If there's continued improvement, discussed that we would need to repeat CT scan likely in 4-5 days.  The patient has [employer] assistance, but unfortunately [facility] is currently not accepting any transfers.  Will reassess in AM.  Olean Ree, MD

## 2021-01-22 NOTE — Progress Notes (Signed)
01/22/21 3:43 PM  Reviewed patient's repeat CT Abdomen/Pelvis from this afternoon which is concerning for increase in intra-abdominal free air compared to imaging on admission. However, the previously seen inflammation around the sigmoid is improved compared to prior. I did also review the images via telephone with Dr Hampton Abbot and reading radiologist.   Clinically, the patient has continued to make improvements. His abdominal pain has essentially resolved, he has remained afebrile throughout, and his leukocytosis had normalized this morning. At this time, there still may be some question whether or not he continues to have active perforation or if these changes are residual from after his initial CT presentation and he has sealed his perforation given his significant improvements clinically. Unfortunately, the PO contrast has not traversed this area.   I did reassess the patient at bedside, and he is doing well. He remains without any degree of abdominal discomfort and certainly is not peritonitic.   Given the somewhat mixed picture, we will plan on repeating CT Abdomen/Pelvis around 1800 to allow time for the PO contrast to traverse his distal colon and allow [place] to better evaluate for perforation or extravasation of PO contrast. Again, he is not currently septic and there is no evidence of peritonitis. He, and his family at bedside, understand that he still may need to undergo urgent exploratory laparotomy, colectomy, and temporizing colostomy (Hartmann's Procedure).   Again, of note, he does have [facility] care and we discuss the potential for transfer, for cost reasons, if he is clinically stable to do so and they are accepting.   Please contact us should he deteriorate in the interim (ie: Fever >101, severe abdominal pain). I did make him NPO.   -- Edison Simon, PA-C [facility] 01/22/2021, 3:52 PM [phone] M-F: 7am - 4pm

## 2021-01-22 NOTE — Progress Notes (Signed)
[facility] SURGICAL PROGRESS NOTE (cpt [phone])  Hospital Day(s): 4.   Interval History: Patient seen and examined, no acute events or new complaints overnight. Patient reports he continues to make positive improvements daily. Still with some soreness in LLQ but again this is markedly improved. He denies fever, chills, nausea, emesis. Repeat CBC is pending; leukocytosis has trended down x48 hours. He has been on full liquids and tolerating well. He is passing flatus and awaiting BM.   Review of Systems:  Constitutional: denies fever, chills  HEENT: denies cough or congestion  Respiratory: denies any shortness of breath  Cardiovascular: denies chest pain or palpitations  Gastrointestinal: + abdominal pain (minimal; improved), denied  N/V Genitourinary: denies burning with urination or urinary frequency Musculoskeletal: denies pain, decreased motor or sensation  Vital signs in last 24 hours: [min-max] current  Temp:  [97.8 F (36.6 C)-98.8 F (37.1 C)] 97.8 F (36.6 C) (01/18 0325) Pulse Rate:  [79-86] 85 (01/18 0325) Resp:  [16-20] 18 (01/18 0325) BP: (131-140)/(88-99) 140/99 (01/18 0325) SpO2:  [94 %-99 %] 94 % (01/18 0325)     Height: 5\' 9"  (175.3 cm) Weight: 90.7 kg BMI (Calculated): 29.52   Intake/Output last 2 shifts:  01/17 0701 - 01/18 0700 In: 1126.3 [P.O.:820; IV Piggyback:306.3] Out: -    Physical Exam:  Constitutional: alert, cooperative and no distress  HENT: normocephalic without obvious abnormality  Eyes: PERRL, EOM's grossly intact and symmetric  Respiratory: breathing non-labored at rest  Cardiovascular: regular rate and sinus rhythm  Gastrointestinal: Soft, very mild (if any) LLQ soreness, non-distended, no rebound/guarding. He is without evidence of peritonitis  Musculoskeletal: no edema or wounds, motor and sensation grossly intact, NT    Labs:  CBC Latest Ref Rng & Units 01/21/2021 01/20/2021 01/19/2021  WBC 4.0 - 10.5 K/uL 11.7(H) 14.0(H)  25.9(H)  Hemoglobin 13.0 - 17.0 g/dL 11.0(L) 11.2(L) 12.0(L)  Hematocrit 39.0 - 52.0 % 32.5(L) 33.4(L) 35.9(L)  Platelets 150 - 400 K/uL 244 260 287   CMP Latest Ref Rng & Units 01/20/2021 01/19/2021 01/18/2021  Glucose 70 - 99 mg/dL 80 110(H) 121(H)  BUN 6 - 20 mg/dL 13 15 19   Creatinine 0.61 - 1.24 mg/dL 0.97 0.89 0.84  Sodium 135 - 145 mmol/L 136 132(L) 133(L)  Potassium 3.5 - 5.1 mmol/L 3.7 3.8 4.2  Chloride 98 - 111 mmol/L 105 105 106  CO2 22 - 32 mmol/L 25 23 21(L)  Calcium 8.9 - 10.3 mg/dL 8.8(L) 8.6(L) 9.4  Total Protein 6.5 - 8.1 g/dL - - 7.6  Total Bilirubin 0.3 - 1.2 mg/dL - - 0.8  Alkaline Phos 38 - 126 U/L - - 58  AST 15 - 41 U/L - - 23  ALT 0 - 44 U/L - - 31     Imaging studies: No new pertinent imaging studies   Assessment/Plan: (ICD-10's: K36.92) 56 y.o. male with improving leukocytosis and abdominal pain admitted with complicated diverticulitis with perforation               - we will go ahead and repeat CT Abdomen/Pelvis today to ensure his intra-abdominal process has made improvements  - Advance to soft diet             - Continue IV Abx (Zosyn); he will certainly need PO for home   - Monitor leukocytosis; improving              - No emergent surgical intervention; He understands that should he fail conservative measures or deteriorate, we  would need to proceed with temporizing colectomy.              - Monitor abdominal examination; on-going bowel function              - Pain control prn; antiemetics prn              - Okay to ambulate as tolerated    - Discharge Planning: Pending CT and toleration of diet. If everything looks good, will plan on DC tomorrow morning   All of the above findings and recommendations were discussed with the patient, and the medical team, and all of patient's questions were answered to his expressed satisfaction.  -- Edison Simon, PA-C [facility] 01/22/2021, 7:33 AM [phone] M-F: 7am - 4pm

## 2021-01-23 ENCOUNTER — Encounter
Admission: EM | Disposition: A | Discharge status: Home Health | Payer: Self-pay | Source: Home / Self Care | Attending: Surgery

## 2021-01-23 ENCOUNTER — Inpatient Hospital Stay: Payer: Self-pay

## 2021-01-23 ENCOUNTER — Inpatient Hospital Stay: Payer: Self-pay | Admitting: Anesthesiology

## 2021-01-23 HISTORY — PX: COLECTOMY WITH COLOSTOMY CREATION/HARTMANN PROCEDURE: SHX6598

## 2021-01-23 HISTORY — PX: LAPAROTOMY: SHX154

## 2021-01-23 LAB — BASIC METABOLIC PANEL
Anion gap: 6 (ref 5–15)
BUN: 10 mg/dL (ref 6–20)
CO2: 24 mmol/L (ref 22–32)
Calcium: 8.7 mg/dL — ABNORMAL LOW (ref 8.9–10.3)
Chloride: 108 mmol/L (ref 98–111)
Creatinine, Ser: 0.85 mg/dL (ref 0.61–1.24)
GFR, Estimated: >60 mL/min (ref >60)
Glucose, Bld: 108 mg/dL — ABNORMAL HIGH (ref 70–99)
Potassium: 3.6 mmol/L (ref 3.5–5.1)
Sodium: 138 mmol/L (ref 135–145)

## 2021-01-23 LAB — CBC
HCT: 32.7 % — ABNORMAL LOW (ref 39.0–52.0)
Hemoglobin: 11.3 g/dL — ABNORMAL LOW (ref 13.0–17.0)
MCH: 31.9 pg (ref 26.0–34.0)
MCHC: 34.6 g/dL (ref 30.0–36.0)
MCV: 92.4 fL (ref 80.0–100.0)
Platelets: 295 10*3/uL (ref 150–400)
RBC: 3.54 MIL/uL — ABNORMAL LOW (ref 4.22–5.81)
RDW: 13.2 % (ref 11.5–15.5)
WBC: 9.8 10*3/uL (ref 4.0–10.5)
nRBC: 0 % (ref 0.0–0.2)

## 2021-01-23 LAB — CULTURE, BLOOD (ROUTINE X 2): Culture: NO GROWTH

## 2021-01-23 LAB — MAGNESIUM: Magnesium: 2.4 mg/dL (ref 1.7–2.4)

## 2021-01-23 SURGERY — LAPAROTOMY, EXPLORATORY
Anesthesia: General

## 2021-01-23 MED ORDER — ACETAMINOPHEN 10 MG/ML IV SOLN
INTRAVENOUS | Status: DC | PRN
  Administered 2021-01-23: 1000 mg via INTRAVENOUS

## 2021-01-23 MED ORDER — ACETAMINOPHEN 10 MG/ML IV SOLN: 1000.0000 mg | Freq: Once | INTRAVENOUS | Status: DC | PRN

## 2021-01-23 MED ORDER — PIPERACILLIN-TAZOBACTAM 3.375 G IVPB
3.3750 g | Freq: Three times a day (TID) | INTRAVENOUS | Status: DC
  Administered 2021-01-23 – 2021-02-01 (×29): 3.375 g via INTRAVENOUS
  Filled 2021-01-23 (×27): qty 50

## 2021-01-23 MED ORDER — DEXMEDETOMIDINE HCL IN NACL 200 MCG/50ML IV SOLN
INTRAVENOUS | Status: DC | PRN
  Administered 2021-01-23: .4 ug/kg/h via INTRAVENOUS

## 2021-01-23 MED ORDER — 0.9 % SODIUM CHLORIDE (POUR BTL) OPTIME
TOPICAL | Status: DC | PRN
  Administered 2021-01-23: 3000 mL

## 2021-01-23 MED ORDER — PROPOFOL 10 MG/ML IV BOLUS
INTRAVENOUS | Status: AC
  Filled 2021-01-23: qty 40

## 2021-01-23 MED ORDER — LIDOCAINE HCL (CARDIAC) PF 100 MG/5ML IV SOSY
PREFILLED_SYRINGE | INTRAVENOUS | Status: DC | PRN
  Administered 2021-01-23: 100 mg via INTRAVENOUS

## 2021-01-23 MED ORDER — EPHEDRINE SULFATE (PRESSORS) 50 MG/ML IJ SOLN
INTRAMUSCULAR | Status: DC | PRN
  Administered 2021-01-23: 5 mg via INTRAVENOUS

## 2021-01-23 MED ORDER — MIDAZOLAM HCL 2 MG/2ML IJ SOLN
INTRAMUSCULAR | Status: AC
  Filled 2021-01-23: qty 2

## 2021-01-23 MED ORDER — HYDROMORPHONE HCL 1 MG/ML IJ SOLN: 0.2500 mg | INTRAMUSCULAR | Status: DC | PRN

## 2021-01-23 MED ORDER — PROMETHAZINE HCL 25 MG/ML IJ SOLN: 6.2500 mg | INTRAMUSCULAR | Status: DC | PRN

## 2021-01-23 MED ORDER — CALCIUM CHLORIDE 10 % IV SOLN
INTRAVENOUS | Status: AC
  Filled 2021-01-23: qty 10

## 2021-01-23 MED ORDER — MIDAZOLAM HCL 2 MG/2ML IJ SOLN
INTRAMUSCULAR | Status: DC | PRN
  Administered 2021-01-23: 2 mg via INTRAVENOUS

## 2021-01-23 MED ORDER — SUCCINYLCHOLINE CHLORIDE 200 MG/10ML IV SOSY
PREFILLED_SYRINGE | INTRAVENOUS | Status: DC | PRN
  Administered 2021-01-23: 100 mg via INTRAVENOUS

## 2021-01-23 MED ORDER — PIPERACILLIN-TAZOBACTAM 3.375 G IVPB
INTRAVENOUS | Status: AC
  Filled 2021-01-23: qty 50

## 2021-01-23 MED ORDER — DEXMEDETOMIDINE (PRECEDEX) IN NS 20 MCG/5ML (4 MCG/ML) IV SYRINGE
PREFILLED_SYRINGE | INTRAVENOUS | Status: AC
  Filled 2021-01-23: qty 5

## 2021-01-23 MED ORDER — DROPERIDOL 2.5 MG/ML IJ SOLN
0.6250 mg | Freq: Once | INTRAMUSCULAR | Status: DC | PRN
  Filled 2021-01-23: qty 2

## 2021-01-23 MED ORDER — DEXAMETHASONE SODIUM PHOSPHATE 10 MG/ML IJ SOLN
INTRAMUSCULAR | Status: DC | PRN
  Administered 2021-01-23: 8 mg via INTRAVENOUS

## 2021-01-23 MED ORDER — CALCIUM CHLORIDE 10 % IV SOLN
INTRAVENOUS | Status: DC | PRN
  Administered 2021-01-23 (×3): 250 mg via INTRAVENOUS

## 2021-01-23 MED ORDER — HYDROMORPHONE HCL 1 MG/ML IJ SOLN
INTRAMUSCULAR | Status: DC | PRN
  Administered 2021-01-23 (×4): .5 mg via INTRAVENOUS

## 2021-01-23 MED ORDER — DEXMEDETOMIDINE (PRECEDEX) IN NS 20 MCG/5ML (4 MCG/ML) IV SYRINGE
PREFILLED_SYRINGE | INTRAVENOUS | Status: DC | PRN
  Administered 2021-01-23: 8 ug via INTRAVENOUS
  Administered 2021-01-23: 12 ug via INTRAVENOUS

## 2021-01-23 MED ORDER — BUPIVACAINE-EPINEPHRINE 0.5% -1:200000 IJ SOLN
INTRAMUSCULAR | Status: DC | PRN
  Administered 2021-01-23: 30 mL

## 2021-01-23 MED ORDER — LACTATED RINGERS IV SOLN
INTRAVENOUS | Status: DC | PRN
Start: 2021-01-23 — End: 2021-01-23

## 2021-01-23 MED ORDER — SUGAMMADEX SODIUM 200 MG/2ML IV SOLN
INTRAVENOUS | Status: DC | PRN
  Administered 2021-01-23: 200 mg via INTRAVENOUS

## 2021-01-23 MED ORDER — OXYCODONE HCL 5 MG PO TABS: 5.0000 mg | ORAL_TABLET | Freq: Once | ORAL | Status: DC | PRN

## 2021-01-23 MED ORDER — FENTANYL CITRATE (PF) 100 MCG/2ML IJ SOLN
INTRAMUSCULAR | Status: AC
  Filled 2021-01-23: qty 2

## 2021-01-23 MED ORDER — OXYCODONE HCL 5 MG/5ML PO SOLN: 5.0000 mg | Freq: Once | ORAL | Status: DC | PRN

## 2021-01-23 MED ORDER — ONDANSETRON HCL 4 MG/2ML IJ SOLN
INTRAMUSCULAR | Status: DC | PRN
  Administered 2021-01-23: 4 mg via INTRAVENOUS

## 2021-01-23 MED ORDER — PROPOFOL 10 MG/ML IV BOLUS
INTRAVENOUS | Status: DC | PRN
  Administered 2021-01-23: 200 mg via INTRAVENOUS

## 2021-01-23 MED ORDER — SODIUM CHLORIDE (PF) 0.9 % IJ SOLN
INTRAMUSCULAR | Status: DC | PRN
  Administered 2021-01-23: 30 mL via INTRAVENOUS

## 2021-01-23 MED ORDER — BUPIVACAINE LIPOSOME 1.3 % IJ SUSP
INTRAMUSCULAR | Status: DC | PRN
  Administered 2021-01-23: 20 mL

## 2021-01-23 MED ORDER — ROCURONIUM BROMIDE 100 MG/10ML IV SOLN
INTRAVENOUS | Status: DC | PRN
  Administered 2021-01-23 (×2): 30 mg via INTRAVENOUS
  Administered 2021-01-23 (×2): 20 mg via INTRAVENOUS
  Administered 2021-01-23: 50 mg via INTRAVENOUS

## 2021-01-23 MED ORDER — HYDROMORPHONE HCL 1 MG/ML IJ SOLN
INTRAMUSCULAR | Status: AC
  Filled 2021-01-23: qty 1

## 2021-01-23 MED ORDER — SODIUM CHLORIDE 0.9 % IV SOLN: INTRAVENOUS | Status: DC | PRN

## 2021-01-23 MED ORDER — PHENYLEPHRINE 40 MCG/ML (10ML) SYRINGE FOR IV PUSH (FOR BLOOD PRESSURE SUPPORT)
PREFILLED_SYRINGE | INTRAVENOUS | Status: DC | PRN
  Administered 2021-01-23 (×3): 100 ug via INTRAVENOUS

## 2021-01-23 MED ORDER — FENTANYL CITRATE (PF) 100 MCG/2ML IJ SOLN
INTRAMUSCULAR | Status: DC | PRN
Start: 2021-01-23 — End: 2021-01-23
  Administered 2021-01-23: 100 ug via INTRAVENOUS

## 2021-01-23 MED ORDER — FENTANYL CITRATE (PF) 100 MCG/2ML IJ SOLN: 25.0000 ug | INTRAMUSCULAR | Status: DC | PRN

## 2021-01-23 SURGICAL SUPPLY — 52 items
ADHESIVE MASTISOL STRL (MISCELLANEOUS) ×1 IMPLANT
BARRIER ADH SEPRAFILM 3INX5IN (MISCELLANEOUS) ×2 IMPLANT
BLADE SURG SZ10 CARB STEEL (BLADE) ×2 IMPLANT
BULB RESERV EVAC DRAIN JP 100C (MISCELLANEOUS) ×1 IMPLANT
DRAIN CHANNEL JP 19F (MISCELLANEOUS) ×1 IMPLANT
DRAIN PENROSE 1/4X12 LTX STRL (WOUND CARE) ×1 IMPLANT
DRAPE LAPAROTOMY 100X77 ABD (DRAPES) ×2 IMPLANT
DRSG OPSITE POSTOP 4X12 (GAUZE/BANDAGES/DRESSINGS) ×1 IMPLANT
DRSG TEGADERM 4X4.75 (GAUZE/BANDAGES/DRESSINGS) ×1 IMPLANT
ELECT BLADE 6.5 EXT (BLADE) ×2 IMPLANT
ELECT CAUTERY BLADE TIP 2.5 (TIP) ×2
ELECT REM PT RETURN 9FT ADLT (ELECTROSURGICAL) ×2
ELECTRODE CAUTERY BLDE TIP 2.5 (TIP) ×1 IMPLANT
ELECTRODE REM PT RTRN 9FT ADLT (ELECTROSURGICAL) ×1 IMPLANT
GAUZE SPONGE 4X4 12PLY STRL (GAUZE/BANDAGES/DRESSINGS) ×1 IMPLANT
GLOVE SURG SYN 7.0 (GLOVE) ×6 IMPLANT
GLOVE SURG SYN 7.0 PF PI (GLOVE) ×3 IMPLANT
GLOVE SURG SYN 7.5  E (GLOVE) ×3
GLOVE SURG SYN 7.5 E (GLOVE) ×3 IMPLANT
GLOVE SURG SYN 7.5 PF PI (GLOVE) ×3 IMPLANT
GOWN STRL REUS W/ TWL LRG LVL3 (GOWN DISPOSABLE) ×4 IMPLANT
GOWN STRL REUS W/TWL LRG LVL3 (GOWN DISPOSABLE) ×3
HANDLE YANKAUER SUCT BULB TIP (MISCELLANEOUS) ×2 IMPLANT
HOLDER FOLEY CATH W/STRAP (MISCELLANEOUS) ×2 IMPLANT
KIT OSTOMY 2 PC DRNBL 2.25 STR (WOUND CARE) IMPLANT
KIT OSTOMY DRAINABLE 2.25 STR (WOUND CARE) ×1
KIT TURNOVER KIT A (KITS) ×2 IMPLANT
LABEL OR SOLS (LABEL) ×2 IMPLANT
MANIFOLD NEPTUNE II (INSTRUMENTS) ×2 IMPLANT
NEEDLE HYPO 22GX1.5 SAFETY (NEEDLE) ×2 IMPLANT
NS IRRIG 1000ML POUR BTL (IV SOLUTION) ×5 IMPLANT
PACK BASIN MAJOR ARMC (MISCELLANEOUS) ×2 IMPLANT
PACK LAP CHOLECYSTECTOMY (MISCELLANEOUS) ×2 IMPLANT
RELOAD PROXIMATE 75MM BLUE (ENDOMECHANICALS) ×2 IMPLANT
RELOAD STAPLE 75 3.8 BLU REG (ENDOMECHANICALS) IMPLANT
SPONGE T-LAP 18X18 ~~LOC~~+RFID (SPONGE) ×7 IMPLANT
STAPLER CVD CUT BL 40 RELOAD (ENDOMECHANICALS) ×2 IMPLANT
STAPLER CVD CUT BLU 40 RELOAD (ENDOMECHANICALS) IMPLANT
STAPLER PROXIMATE 75MM BLUE (STAPLE) ×1 IMPLANT
STAPLER SKIN PROX 35W (STAPLE) ×2 IMPLANT
SUT ETHILON 3-0 FS-10 30 BLK (SUTURE) ×2
SUT PDS AB 1 CT1 36 (SUTURE) ×2 IMPLANT
SUT PROLENE 2 0 SH DA (SUTURE) ×1 IMPLANT
SUT SILK 3-0 (SUTURE) ×2 IMPLANT
SUT VIC AB 3-0 SH 27 (SUTURE) ×1
SUT VIC AB 3-0 SH 27X BRD (SUTURE) ×1 IMPLANT
SUT VIC AB 3-0 SH 8-18 (SUTURE) ×1 IMPLANT
SUTURE EHLN 3-0 FS-10 30 BLK (SUTURE) IMPLANT
SYR 10ML LL (SYRINGE) ×2 IMPLANT
TOWEL OR 17X26 4PK STRL BLUE (TOWEL DISPOSABLE) ×2 IMPLANT
TRAY FOLEY MTR SLVR 16FR STAT (SET/KITS/TRAYS/PACK) ×2 IMPLANT
WATER STERILE IRR 500ML POUR (IV SOLUTION) ×2 IMPLANT

## 2021-01-23 NOTE — Anesthesia Procedure Notes (Signed)
Procedure Name: Intubation Date/Time: 01/23/2021 6:14 PM Performed by: Lowry Bowl, CRNA Pre-anesthesia Checklist: Patient identified, Emergency Drugs available, Suction available and Patient being monitored Patient Re-evaluated:Patient Re-evaluated prior to induction Oxygen Delivery Method: Circle system utilized Preoxygenation: Pre-oxygenation with 100% oxygen Induction Type: IV induction and Rapid sequence Laryngoscope Size: McGraph and 4 Grade View: Grade I Tube type: Oral Tube size: 7.5 mm Number of attempts: 1 Airway Equipment and Method: Stylet and Video-laryngoscopy Placement Confirmation: ETT inserted through vocal cords under direct vision, positive ETCO2 and breath sounds checked- equal and bilateral Secured at: 22 cm Tube secured with: Tape Dental Injury: Teeth and Oropharynx as per pre-operative assessment

## 2021-01-23 NOTE — Brief Op Note (Signed)
01/23/2021  10:37 PM  PATIENT:  Hunter [name]  56 y.o. male  PRE-OPERATIVE DIAGNOSIS:  Perforated diverticulitis  POST-OPERATIVE DIAGNOSIS:  Perforated diverticulitis with abscess  PROCEDURE:  Procedure(s): EXPLORATORY LAPAROTOMY (N/A) COLECTOMY WITH COLOSTOMY CREATION/HARTMANN PROCEDURE (N/A)  SURGEON:  Surgeon(s) and Role:    * Olean Ree, MD - Primary  PHYSICIAN ASSISTANT: Edison Simon, PA-C  ASSISTANTS: Rosita Fire, PA-S   ANESTHESIA:   general  EBL:  100 mL   BLOOD ADMINISTERED:none  DRAINS: Penrose drain in the subcutaneous space and (19 Fr) Blake drain(s) in the pelvis and left lower quadrant.    LOCAL MEDICATIONS USED:  BUPIVICAINE   SPECIMEN:  Source of Specimen:  Sigmoid colon  DISPOSITION OF SPECIMEN:  PATHOLOGY  COUNTS:  YES  DICTATION: .Dragon Dictation  PLAN OF CARE: Admit to inpatient   PATIENT DISPOSITION:  PACU - hemodynamically stable.   Delay start of Pharmacological VTE agent (>24hrs) due to surgical blood loss or risk of bleeding: yes

## 2021-01-23 NOTE — Anesthesia Preprocedure Evaluation (Addendum)
Anesthesia Evaluation  Patient identified by MRN, date of birth, ID band Patient awake    Reviewed: Allergy & Precautions, NPO status , Patient's Chart, lab work & pertinent test results  Airway Mallampati: III  TM Distance: >3 FB Neck ROM: Full    Dental  (+) Poor Dentition, Missing   Pulmonary Current Smoker and Patient abstained from smoking.,  High risk for OSA   Pulmonary exam normal breath sounds clear to auscultation       Cardiovascular Exercise Tolerance: Good negative cardio ROS Normal cardiovascular exam Rhythm:Regular Rate:Normal     Neuro/Psych negative neurological ROS  negative psych ROS   GI/Hepatic Neg liver ROS,   Endo/Other  negative endocrine ROS  Renal/GU negative Renal ROS  negative genitourinary   Musculoskeletal negative musculoskeletal ROS (+)   Abdominal (+) + obese,  Abdomen: soft.    Peds negative pediatric ROS (+)  Hematology  (+) anemia ,   Anesthesia Other Findings Perforated diverticulitis Hypocalcemia  H/o Methamphetamine and alcohol use  X-RAY: IMPRESSION: Small to moderate pneumoperitoneum. There is no evidence of intestinal obstruction.  Reproductive/Obstetrics negative OB ROS                            Anesthesia Physical Anesthesia Plan  ASA: 3  Anesthesia Plan: General   Post-op Pain Management:    Induction: Intravenous  PONV Risk Score and Plan: 2 and Ondansetron, Dexamethasone and Midazolam  Airway Management Planned: Oral ETT  Additional Equipment:   Intra-op Plan:   Post-operative Plan: Extubation in OR  Informed Consent: I have reviewed the patients History and Physical, chart, labs and discussed the procedure including the risks, benefits and alternatives for the proposed anesthesia with the patient or authorized representative who has indicated his/her understanding and acceptance.     Dental Advisory Given  Plan  Discussed with: Anesthesiologist, CRNA and Surgeon  Anesthesia Plan Comments: (Patient consented for risks of anesthesia including but not limited to:  - adverse reactions to medications - risk of airway placement if required - damage to eyes, teeth, lips or other oral mucosa - nerve damage due to positioning  - sore throat or hoarseness - Damage to heart, brain, nerves, lungs, other parts of body or loss of life  Patient voiced understanding.)        Anesthesia Quick Evaluation

## 2021-01-23 NOTE — Progress Notes (Signed)
Initial Nutrition Assessment  DOCUMENTATION CODES:   Not applicable  INTERVENTION:   RD will monitor for diet advancement and add supplements when appropriate   Recommend TPN if unable to advance diet in the next 48 hours or if surgery is indicated   Pt at mild to moderate refeed risk; recommend monitor potassium, magnesium and phosphorus labs daily until stable  NUTRITION DIAGNOSIS:   Inadequate oral intake related to acute illness as evidenced by NPO status.  GOAL:   Patient will meet greater than or equal to 90% of their needs  MONITOR:   Diet advancement, Labs, Weight trends, Skin, I & O's  REASON FOR ASSESSMENT:   NPO/Clear Liquid Diet    ASSESSMENT:   56 y/o male with h/o substance abuse who is admitted with perforated diverticulitis.  Met with pt in room today. Pt reports decreased appetite and oral intake for several days pta r/t abdominal pain. Pt reports that he continues to have abdominal pain today but reports that this is better than when he first admitted. Pt is tender on palpation today and abdomen appears to be moderately distended. Pt reports that he is hungry and would like to eat. Pt briefly advanced to a soft diet on 1/18 and reports eating ~50% of his meals at that time. CT scan from 1/18 reports "continued findings of sigmoid diverticulitis with stable pneumoperitoneum and left mid abdominal gas/fluid collections as seen on earlier CT." Discussed with pt the importance of adequate nutrition needed to preserve lean muscle. Pt is willing to drink chocolate supplements once his diet is advanced. Recommend TPN if unable to advance pt's diet in the next 48 hours or if surgery is indicated; this was discussed with NP. Pt is likely at low to moderate refeed risk. Per chart, pt appears weight stable pta. Pt currently up ~4lbs since admission.   Medications reviewed and include: lovenox, protonix, NaCl w/ 5% dextrose & Kcl _0 /hr, zosyn   Labs reviewed: K 3.6  wnl, Mg 2.4 wnl  NUTRITION - FOCUSED PHYSICAL EXAM:  Flowsheet Row Most Recent Value  Orbital Region No depletion  Upper Arm Region No depletion  Thoracic and Lumbar Region No depletion  Buccal Region No depletion  Temple Region No depletion  Clavicle Bone Region No depletion  Clavicle and Acromion Bone Region No depletion  Scapular Bone Region No depletion  Dorsal Hand No depletion  Patellar Region No depletion  Anterior Thigh Region No depletion  Posterior Calf Region Mild depletion  Edema (RD Assessment) None  Hair Reviewed  Eyes Reviewed  Mouth Reviewed  Skin Reviewed  Nails Reviewed   Diet Order:   Diet Order             Diet NPO time specified Except for: Ice Chips, Sips with Meds  Diet effective now                  EDUCATION NEEDS:   Education needs have been addressed  Skin:  Skin Assessment: Reviewed RN Assessment  Last BM:  1/18  Height:   Ht Readings from Last 1 Encounters:  01/18/21 _1  (1.753 m)    Weight:   Wt Readings from Last 1 Encounters:  01/23/21 92.6 kg    Ideal Body Weight:  72.7 kg  BMI:  Body mass index is 30.15 kg/m.  Estimated Nutritional Needs:   Kcal:  2000-2300kcal/day  Protein:  100-115g/day  Fluid:  2.2-2.5L/day  Koleen Distance MS, RD, LDN Please refer to [facility] for RD and/or RD  on-call/weekend/after hours pager

## 2021-01-23 NOTE — Progress Notes (Signed)
[facility] SURGICAL PROGRESS NOTE (cpt [phone])  Hospital Day(s): 5.   Interval History:  Patient seen and examined  Again, Repeat CT last night was concerning for new intra-abdominal free air. However, patient has clinically been doing well with resolution in leukocytosis, afebrile, and absolutely no evidence of peritonitis on examination. We attempted to repeat CT later in the evening to allow contrast to traverse the distal colon but this was unsuccessful.   This morning, patient reports that he remains pain free aside form very mild soreness and a slight amount of distension. No fever, chills, nausea, emesis. His leukocytosis has remained resolved; 9.8K. Renal function is normal; sCr - 0.85; UO - unmeasured. No electrolyte derangements. He has remained NPO. He is on Zosyn.   Review of Systems:  Constitutional: denies fever, chills  HEENT: denies cough or congestion  Respiratory: denies any shortness of breath  Cardiovascular: denies chest pain or palpitations  Gastrointestinal: + abdominal pain (minimal; improved), denied  N/V Genitourinary: denies burning with urination or urinary frequency Musculoskeletal: denies pain, decreased motor or sensation  Vital signs in last 24 hours: [min-max] current  Temp:  [97.9 F (36.6 C)-99.4 F (37.4 C)] 99.4 F (37.4 C) (01/19 0534) Pulse Rate:  [66-81] 75 (01/19 0534) Resp:  [16-18] 18 (01/19 0534) BP: (141-155)/(91-114) 155/95 (01/19 0534) SpO2:  [96 %-100 %] 100 % (01/19 0534)     Height: 5\' 9"  (175.3 cm) Weight: 90.7 kg BMI (Calculated): 29.52   Intake/Output last 2 shifts:  01/18 0701 - 01/19 0700 In: 622.7 [P.O.:240; I.V.:290.3; IV Piggyback:92.4] Out: -    Physical Exam:  Constitutional: alert, cooperative and no distress  HENT: normocephalic without obvious abnormality  Eyes: PERRL, EOM's grossly intact and symmetric  Respiratory: breathing non-labored at rest  Cardiovascular: regular rate and sinus rhythm   Gastrointestinal: Soft, he does no appear overtly tender, mild distension if any, no rebound/guarding. He is without evidence of peritonitis  Musculoskeletal: no edema or wounds, motor and sensation grossly intact, NT    Labs:  CBC Latest Ref Rng & Units 01/23/2021 01/22/2021 01/21/2021  WBC 4.0 - 10.5 K/uL 9.8 7.5 11.7(H)  Hemoglobin 13.0 - 17.0 g/dL 11.3(L) 11.2(L) 11.0(L)  Hematocrit 39.0 - 52.0 % 32.7(L) 32.9(L) 32.5(L)  Platelets 150 - 400 K/uL 295 272 244   CMP Latest Ref Rng & Units 01/23/2021 01/20/2021 01/19/2021  Glucose 70 - 99 mg/dL 108(H) 80 110(H)  BUN 6 - 20 mg/dL 10 13 15   Creatinine 0.61 - 1.24 mg/dL 0.85 0.97 0.89  Sodium 135 - 145 mmol/L 138 136 132(L)  Potassium 3.5 - 5.1 mmol/L 3.6 3.7 3.8  Chloride 98 - 111 mmol/L 108 105 105  CO2 22 - 32 mmol/L 24 25 23   Calcium 8.9 - 10.3 mg/dL 8.7(L) 8.8(L) 8.6(L)  Total Protein 6.5 - 8.1 g/dL - - -  Total Bilirubin 0.3 - 1.2 mg/dL - - -  Alkaline Phos 38 - 126 U/L - - -  AST 15 - 41 U/L - - -  ALT 0 - 44 U/L - - -     Imaging studies: No new pertinent imaging studies   Assessment/Plan: (ICD-10's: K35.92) 56 y.o. male with improving leukocytosis and abdominal pain admitted with complicated diverticulitis with perforation               - Again, although he has increase in intra-abdominal free air on repeat CT Abdomen/pelvis yesterday, he has remained without leukocytosis, afebrile, and certainly without peritonitis on examination. As such, we  will still try to attempt conservative management with the understanding that should he deteriorate in any manner, we would need to proceed with surgical intervention urgently. Can also consider laparoscopic washout. We will repeat 2 View KUB (Upright and lateral) to ensure no marked increase in free air.     - Will do CLD today; low threshold for NPO              - Continue IV Abx (Zosyn)  - Monitor leukocytosis; resolved x48 hours             - Monitor abdominal examination; on-going  bowel function              - Pain control prn; antiemetics prn              - Okay to ambulate as tolerated   All of the above findings and recommendations were discussed with the patient, and the medical team, and all of patient's questions were answered to his expressed satisfaction.  -- Edison Simon, PA-C [facility] 01/23/2021, 7:34 AM [phone] M-F: 7am - 4pm

## 2021-01-23 NOTE — Transfer of Care (Signed)
Immediate Anesthesia Transfer of Care Note  Patient: Hunter [name]  Procedure(s) Performed: EXPLORATORY LAPAROTOMY COLECTOMY WITH COLOSTOMY CREATION/HARTMANN PROCEDURE  Patient Location: PACU  Anesthesia Type:General  Level of Consciousness: awake and alert   Airway & Oxygen Therapy: Patient Spontanous Breathing and Patient connected to face mask oxygen  Post-op Assessment: Report given to RN and Post -op Vital signs reviewed and stable  Post vital signs: Reviewed  Last Vitals:  Vitals Value Taken Time  BP 155/86 01/23/21 2240  Temp    Pulse 117 01/23/21 2242  Resp 23 01/23/21 2242  SpO2 99 % 01/23/21 2242  Vitals shown include unvalidated device data.  Last Pain:      Patients Stated Pain Goal: 0 (24/11/46 4314)  Complications: No notable events documented.

## 2021-01-23 NOTE — Plan of Care (Addendum)
°  Problem: Clinical Measurements: Goal: Ability to maintain clinical measurements within normal limits will improve Outcome: Progressing Goal: Will remain free from infection Outcome: Progressing Goal: Diagnostic test results will improve Outcome: Progressing Goal: Respiratory complications will improve Outcome: Progressing Goal: Cardiovascular complication will be avoided Outcome: Progressing   Problem: Pain Managment: Goal: General experience of comfort will improve Outcome: Progressing   Pt is involved in and agrees with the plan of care. V/S stable. Complained of abdominal pain this morning; schedule tylenol and toradol given with relief.. Independent in his room. Kept on Npo except sips with meds and ice chips.

## 2021-01-23 NOTE — Progress Notes (Signed)
01/23/21  Patient evaluated this afternoon.  Patient reports that he has been more uncomfortable this afternoon despite of pain medications.  Reports this started worsening since he had the KUB and had to change positions for it.  Describes decreased flatus and feels his abdomen is tighter.  Last vitals this afternoon at 2 pm show no fever, no tachycardia, no hypotension.    On exam, he does appear less comfortable compared to this morning on my exam.  His abdomen is mildly distended, but has more tenderness in the lower abdomen and mild diffusely.    Given the change in his clinical status, discussed with him that it'd be better to proceed with surgical management of his perforated diverticulitis.  Discussed the role for exploratory laparotomy with sigmoidectomy.  Depending on the condition of his abdomen, may have to do end colostomy vs primary anastomosis with diverting loop ileostomy.  Discussed with him the risks of bleeding, infection, injury to surrounding structures, ostomy creation, post-op course, NG tube, likely needing TPN, and he's willing to proceed.  Will take to OR this evening.  Olean Ree, MD

## 2021-01-24 ENCOUNTER — Inpatient Hospital Stay: Payer: Self-pay

## 2021-01-24 ENCOUNTER — Encounter: Payer: Self-pay | Admitting: Surgery

## 2021-01-24 LAB — CBC
HCT: 36.3 % — ABNORMAL LOW (ref 39.0–52.0)
Hemoglobin: 12.3 g/dL — ABNORMAL LOW (ref 13.0–17.0)
MCH: 31.2 pg (ref 26.0–34.0)
MCHC: 33.9 g/dL (ref 30.0–36.0)
MCV: 92.1 fL (ref 80.0–100.0)
Platelets: 338 10*3/uL (ref 150–400)
RBC: 3.94 MIL/uL — ABNORMAL LOW (ref 4.22–5.81)
RDW: 13.7 % (ref 11.5–15.5)
WBC: 12.7 10*3/uL — ABNORMAL HIGH (ref 4.0–10.5)
nRBC: 0 % (ref 0.0–0.2)

## 2021-01-24 LAB — BASIC METABOLIC PANEL
Anion gap: 6 (ref 5–15)
BUN: 13 mg/dL (ref 6–20)
CO2: 23 mmol/L (ref 22–32)
Calcium: 8.6 mg/dL — ABNORMAL LOW (ref 8.9–10.3)
Chloride: 108 mmol/L (ref 98–111)
Creatinine, Ser: 0.77 mg/dL (ref 0.61–1.24)
GFR, Estimated: >60 mL/min (ref >60)
Glucose, Bld: 122 mg/dL — ABNORMAL HIGH (ref 70–99)
Potassium: 4.3 mmol/L (ref 3.5–5.1)
Sodium: 137 mmol/L (ref 135–145)

## 2021-01-24 LAB — CULTURE, BLOOD (ROUTINE X 2)
Culture: NO GROWTH
Culture: NO GROWTH

## 2021-01-24 LAB — MAGNESIUM: Magnesium: 2.1 mg/dL (ref 1.7–2.4)

## 2021-01-24 LAB — HEMOGLOBIN A1C
Hgb A1c MFr Bld: 5.8 % — ABNORMAL HIGH (ref 4.8–5.6)
Mean Plasma Glucose: 120 mg/dL

## 2021-01-24 LAB — GLUCOSE, CAPILLARY
Glucose-Capillary: 106 mg/dL — ABNORMAL HIGH (ref 70–99)
Glucose-Capillary: 112 mg/dL — ABNORMAL HIGH (ref 70–99)
Glucose-Capillary: 125 mg/dL — ABNORMAL HIGH (ref 70–99)

## 2021-01-24 MED ORDER — SODIUM CHLORIDE 0.9% FLUSH: 10.0000 mL | INTRAVENOUS | Status: DC | PRN

## 2021-01-24 MED ORDER — DEXTROSE 70 % IV SOLN: INTRAVENOUS | Status: DC

## 2021-01-24 MED ORDER — LACTATED RINGERS IV SOLN: INTRAVENOUS | Status: AC

## 2021-01-24 MED ORDER — SODIUM CHLORIDE 0.9% FLUSH
10.0000 mL | Freq: Two times a day (BID) | INTRAVENOUS | Status: DC
  Administered 2021-01-24 – 2021-01-31 (×13): 10 mL

## 2021-01-24 MED ORDER — TRAVASOL 10 % IV SOLN
INTRAVENOUS | Status: AC
  Filled 2021-01-24: qty 564

## 2021-01-24 MED ORDER — INSULIN ASPART 100 UNIT/ML IJ SOLN
0.0000 [IU] | INTRAMUSCULAR | Status: DC
  Administered 2021-01-24 – 2021-01-26 (×3): 1 [IU] via SUBCUTANEOUS
  Administered 2021-01-26: 2 [IU] via SUBCUTANEOUS
  Administered 2021-01-26 – 2021-01-27 (×6): 1 [IU] via SUBCUTANEOUS
  Filled 2021-01-24 (×11): qty 1

## 2021-01-24 MED ORDER — TRAVASOL 10 % IV SOLN: INTRAVENOUS | Status: DC

## 2021-01-24 MED ORDER — HYDROMORPHONE HCL 1 MG/ML IJ SOLN
0.5000 mg | INTRAMUSCULAR | Status: DC | PRN
  Administered 2021-01-24 – 2021-01-26 (×10): 0.5 mg via INTRAVENOUS
  Filled 2021-01-24 (×11): qty 0.5

## 2021-01-24 MED ORDER — CHLORHEXIDINE GLUCONATE CLOTH 2 % EX PADS
6.0000 | MEDICATED_PAD | Freq: Every day | CUTANEOUS | Status: DC
  Administered 2021-01-24 – 2021-02-01 (×9): 6 via TOPICAL

## 2021-01-24 MED ORDER — KETOROLAC TROMETHAMINE 30 MG/ML IJ SOLN
30.0000 mg | Freq: Four times a day (QID) | INTRAMUSCULAR | Status: AC
  Administered 2021-01-24 – 2021-01-28 (×20): 30 mg via INTRAVENOUS
  Filled 2021-01-24 (×20): qty 1

## 2021-01-24 MED ORDER — ENOXAPARIN SODIUM 40 MG/0.4ML IJ SOSY
40.0000 mg | PREFILLED_SYRINGE | INTRAMUSCULAR | Status: DC
  Administered 2021-01-24 – 2021-01-31 (×8): 40 mg via SUBCUTANEOUS
  Filled 2021-01-24 (×8): qty 0.4

## 2021-01-24 NOTE — Progress Notes (Signed)
Nutrition Follow Up Note   DOCUMENTATION CODES:   Not applicable  INTERVENTION:   TPN per pharmacy  Recommend thiamine 117m daily added to TPN x 3 days  Pt at moderate refeed risk; recommend monitor potassium, magnesium and phosphorus labs daily until stable  Daily weights   NUTRITION DIAGNOSIS:   Inadequate oral intake related to acute illness as evidenced by NPO status.  GOAL:   Patient will meet greater than or equal to 90% of their needs -not met   MONITOR:   Diet advancement, Labs, Weight trends, Skin, I & O's, TPN  ASSESSMENT:   56y/o male with h/o substance abuse who is admitted with perforated diverticulitis.  Pt s/p emergent Hartmann's last night. NGT in place with 855moutput. Blake drain in place with 20073mutput. No ostomy output yet. Plan is for PICC line and TPN today given prolonged NPO and anticipated possible ileus. Pt remains at refeed risk. Per chart, pt up ~7lbs since admission.   Medications reviewed and include: lovenox, insulin, protonix, LRS@50ml /hr, zosyn   Labs reviewed: K 4.3 wnl, Mg 2.1 wnl Wbc- 12.7(H)  NUTRITION - FOCUSED PHYSICAL EXAM:  Flowsheet Row Most Recent Value  Orbital Region No depletion  Upper Arm Region No depletion  Thoracic and Lumbar Region No depletion  Buccal Region No depletion  Temple Region No depletion  Clavicle Bone Region No depletion  Clavicle and Acromion Bone Region No depletion  Scapular Bone Region No depletion  Dorsal Hand No depletion  Patellar Region No depletion  Anterior Thigh Region No depletion  Posterior Calf Region Mild depletion  Edema (RD Assessment) None  Hair Reviewed  Eyes Reviewed  Mouth Reviewed  Skin Reviewed  Nails Reviewed   Diet Order:   Diet Order             Diet NPO time specified Except for: Ice Chips, Sips with Meds  Diet effective now                  EDUCATION NEEDS:   Education needs have been addressed  Skin:  Skin Assessment: Reviewed RN  Assessment  Last BM:  1/18  Height:   Ht Readings from Last 1 Encounters:  01/18/21 5' 9"  (1.753 m)    Weight:   Wt Readings from Last 1 Encounters:  01/24/21 94.2 kg    Ideal Body Weight:  72.7 kg  BMI:  Body mass index is 30.67 kg/m.  Estimated Nutritional Needs:   Kcal:  2000-2300kcal/day  Protein:  100-115g/day  Fluid:  2.2-2.5L/day  CasKoleen Distance, RD, LDN Please refer to [facility] and/or RD on-call/weekend/after hours pager

## 2021-01-24 NOTE — Progress Notes (Addendum)
[place] [facility] Day(s): 6.   Post op day(s): 1 Day Post-Op.   Interval History:  Patient seen and examined No acute events or new complaints overnight.  Patient reports he is doing okay this morning; not overly sore No fever, chills, nausea, emesis Labs are awaiting draw this morning UO - 800 ccs NGT with only 80 ccs out recorded Surgical drain with 90 ccs out; serosanguinous He is NPO No output from ostomy to this point   Vital signs in last 24 hours: [min-max] current  Temp:  [97.5 F (36.4 C)-98.5 F (36.9 C)] 98.4 F (36.9 C) (01/20 0404) Pulse Rate:  [80-113] 80 (01/20 0404) Resp:  [14-20] 18 (01/20 0404) BP: (133-155)/(80-97) 133/93 (01/20 0404) SpO2:  [94 %-99 %] 98 % (01/20 0404) Weight:  [92.6 kg-94.2 kg] 94.2 kg (01/20 0500)     Height: 5\' 9"  (175.3 cm) Weight: 94.2 kg BMI (Calculated): 30.65   Intake/Output last 2 shifts:  01/19 0701 - 01/20 0700 In: 4100 [I.V.:4000; IV Piggyback:100] Out: 1370 [Urine:800; Emesis/NG output:80; Drains:90; Blood:200]   Physical Exam:  Constitutional: alert, cooperative and no distress  HEENT: NGT in place; minimal output Respiratory: breathing non-labored at rest  Cardiovascular: regular rate and sinus rhythm  Gastrointestinal: soft, non-tender, and non-distended. Surgical drain in the RLQ with serosanguinous output. 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 and patent, minimal bowel sweat in bag, no gas or stool Integumentary: Laparotomy incisions is CDI with staples, penrose in place, honeycomb present  Labs:  CBC Latest Ref Rng & Units 01/23/2021 01/22/2021 01/21/2021  WBC 4.0 - 10.5 K/uL 9.8 7.5 11.7(H)  Hemoglobin 13.0 - 17.0 g/dL 11.3(L) 11.2(L) 11.0(L)  Hematocrit 39.0 - 52.0 % 32.7(L) 32.9(L) 32.5(L)  Platelets 150 - 400 K/uL 295 272 244   CMP Latest Ref Rng & Units 01/23/2021 01/20/2021 01/19/2021  Glucose 70 - 99 mg/dL 108(H) 80 110(H)  BUN 6 - 20 mg/dL 10 13 15    Creatinine 0.61 - 1.24 mg/dL 0.85 0.97 0.89  Sodium 135 - 145 mmol/L 138 136 132(L)  Potassium 3.5 - 5.1 mmol/L 3.6 3.7 3.8  Chloride 98 - 111 mmol/L 108 105 105  CO2 22 - 32 mmol/L 24 25 23   Calcium 8.9 - 10.3 mg/dL 8.7(L) 8.8(L) 8.6(L)  Total Protein 6.5 - 8.1 g/dL - - -  Total Bilirubin 0.3 - 1.2 mg/dL - - -  Alkaline Phos 38 - 126 U/L - - -  AST 15 - 41 U/L - - -  ALT 0 - 44 U/L - - -     Imaging studies: No new pertinent imaging studies   Assessment/Plan:  56 y.o. male 1 Day Post-Op s/p exploratory laparotomy, colectomy, and colostomy creation (Hartmann's Procedure) for perforated diverticulitis   - Continue NPO until signs of colostomy function return; will consult for PICC and TPN - Continue NGT decompression; LIS; monitor and record output    - If sCr returns normal, we can discontinue foley catheter later today  - Continue IV Abx (Zosyn); reset clock given OR/perforation; Day 1   - Will engage [place] RN this morning  - Monitor abdominal examination  - Pain control prn; antiemetics  - Mobilization as tolerated; will engage PT if needed  All of the above findings and recommendations were discussed with the patient, patient's family (wife), and the medical team, and all of patient's and family's questions were answered to their expressed satisfaction.  -- Edison Simon, PA-C [facility] 01/24/2021, 7:28 AM  [phone] M-F: 7am - 4pm

## 2021-01-24 NOTE — Op Note (Signed)
Procedure Date:  01/24/2021  Pre-operative Diagnosis:  Perforated diverticulitis with abscess  Post-operative Diagnosis:  Perforated diverticulitis with abscess  Procedure:  Exploratory Laparotomy with Hartmann's procedure  Surgeon:  Melvyn Neth, MD  Assistant:  Edison Simon, PA-C.  His assistance was critical due to the complexity of the case, for appropriate exposure, dissection, resection, and ostomy creation.  Also assisting, Rosita Fire, PA-S  Anesthesia:  General endotracheal  Estimated Blood Loss:  100 ml  Specimens:  sigmoid colon  Complications:  None  Indications for Procedure:  This is a 56 y.o. male who presents with abdominal pain and peritonitis and workup revealing pneumoperitoneum with perforated diverticulitis with abscess.  The risks of bleeding, abscess or infection, injury to surrounding structures, and need for further procedures were all discussed with the patient and was willing to proceed.  Description of Procedure: The patient was correctly identified in the preoperative area and brought into the operating room.  The patient was placed supine with VTE prophylaxis in place.  Appropriate time-outs were performed.  Anesthesia was induced and the patient was intubated.  Foley catheter was placed.  Appropriate antibiotics were infused.  The abdomen was prepped and draped in a sterile fashion.  A midline incision was made and electrocautery was used to dissect down the subcutaneous tissue to the fascia.  The fascia was incised and extended superiorly and inferiorly.  Upon entering the abdomen (organ space), I encountered an abscess in the left abdomen consistent with findings on his CT scan .  The abdomen was explored revealing the abscess in the left abdomen, significant small bowel inflammation surrounding the abscess, and also areas of inflammation in the mesentery of the sigmoid colon.  A true colon perforation was not seen, and possibly the perforation was  sealing itself but he was clearly getting worse clinically.  The small bowel was run from ligament of treitz to the ileocecal valve and adhesions were taken down bluntly between the small bowel and abscess cavity.  The cavity was suctioned of the initial purulent fluid found.  The bowel and omentum were then retracted cephalad and the sigmoid colon was evaluated.  There was no true perforation but an area that was clearly more inflamed.  The sigmoid was taken down but starting lateral to medial along the white line of toldt using cautery.  Careful attention was given to identify the left ureter and maintain it without injury.  The sigmoid was mobilized from mid descending colon to rectum.  Then, window in the mesentery proximally was made and blue load GIA stapler used to transect proximally.  Then Ligasure was used to go across the sigmoid mesentery distally to the rectum.  At that point, a contour blue load stapler was used to transect across the rectosigmoid junction.  The staple line was marked with 2-0 Prolene x 2 for future identification.    Then the abdominal cavity was thoroughly irrigated using 3 L of warm saline.  The descending colon had good reach to the abdominal wall, and a skin incision was made at the site chosen for our colostomy.  Cautery was used to dissect down the subcutaneous tissue, open the rectus sheath fascia and create the ostomy site.  Then, the end colon portion was passed across without complication.  80 ml of Exparel solution was infiltrated onto the peritoneum, fascia, and subcutaneous tissue.  A 19 Fr. Blake drain was placed through the RLQ to the pelvis and LLQ.  The abdomen was then closed using #1  PDS suture x2. The midline wound was irrigated and closed using staples with a penrose drain underneath.  The Blake drain was secured using 3-0 nylon suture.    We then proceeded to mature the end colostomy in standard fashion using multiple 3-0 Vicryl sutures.  Once completed, all  wounds were cleaned.  The midline wound was dressed with Honeycomb dressing and the drain with 4x4 gauze and tegaderm, and the ostomy with an ostomy appliance.  The patient was emerged from anesthesia and extubated and brought to the recovery room for further management.  The patient tolerated the procedure well and all counts were correct at the end of the case.   Melvyn Neth, MD

## 2021-01-24 NOTE — Progress Notes (Signed)
Peripherally Inserted Central Catheter Placement  The IV Nurse has discussed with the patient and/or persons authorized to consent for the patient, the purpose of this procedure and the potential benefits and risks involved with this procedure.  The benefits include less needle sticks, lab draws from the catheter, and the patient may be discharged home with the catheter. Risks include, but not limited to, infection, bleeding, blood clot (thrombus formation), and puncture of an artery; nerve damage and irregular heartbeat and possibility to perform a PICC exchange if needed/ordered by physician.  Alternatives to this procedure were also discussed.  Bard Power PICC patient education guide, fact sheet on infection prevention and patient information card has been provided to patient /or left at bedside.    PICC Placement Documentation  PICC Double Lumen 01/24/21 PICC Right Brachial 42 cm 0 cm (Active)  Indication for Insertion or Continuance of Line Administration of hyperosmolar/irritating solutions (i.e. TPN, Vancomycin, etc.) 01/24/21 1443  Exposed Catheter (cm) 0 cm 01/24/21 1443  Site Assessment Clean;Dry;Intact 01/24/21 1443  Lumen #1 Status Flushed;Saline locked;Blood return noted 01/24/21 1443  Lumen #2 Status Flushed;Saline locked;Blood return noted 01/24/21 1443  Dressing Type Transparent 01/24/21 1443  Dressing Status Clean;Dry;Intact 01/24/21 1443  Antimicrobial disc in place? Yes 01/24/21 1443  Dressing Intervention New dressing;Other (Comment) 01/24/21 1443  Dressing Change Due 01/31/21 01/24/21 1443       Synthia Innocent 01/24/2021, 2:44 PM

## 2021-01-24 NOTE — Consult Note (Addendum)
[place] Nurse ostomy consult note Stoma type/location: LUQ colostomy performed emergently by Dr Hampton Abbot on 01/23/21.  POD 1.  Stomal assessment/size: 1 and 5/8 inches round, raised, edematous, red, lumen in center Peristomal assessment: Medical adhesive related skin injury (MARSI) from 3-5 o'clock measuring 0.2cm wide at lateral adhesive border. Treatment options for stomal/peristomal skin: skin barrier ring Output: scant serosanguinous Ostomy pouching: 2pc. 2 and 3/4 inch pouching system with skin barrier ring and piece of thin film dressing used to cover MARSI (no hydrocolloid available at the time of pouch change) Education provided:  Explained role of ostomy nurse and creation of stoma  Demonstrated pouch change (cutting new skin barrier, measuring stoma, cleaning peristomal skin and stoma, use of barrier ring) Demonstrated Lock N' Roll Closure.  Patient able to give return demo x2 with cueing.  States he understands "mechanical things" pretty well and will learn pouch change. Answered patient questions:  Only regarding NGT. Patient is miserable with tube in and cannot get comfortable. Coughing forcefully causes increased pain at incision and ostomy site. Reinforced the use of a pillow over his abdomen to reduce pain and to reduce the risk of herniation.   Enrolled patient in [place] program: No   Supplies in room (5 of each, pouches, skin barrier rings and skin barriers. Also two pieces of thin hydrocolloid to cover MARSI).  Next planned teaching session is on Monday, 1/23.  Will attempt to reach out to wife so that she can be present at the patient's direction.  [place] nursing team will follow, and will remain available to this patient, the nursing and medical teams.   Thanks, Maudie Flakes, MSN, RN, [place], Arther Abbott  Pager# [phone]

## 2021-01-24 NOTE — Progress Notes (Addendum)
PHARMACY - TOTAL PARENTERAL NUTRITION CONSULT NOTE   Indication: Prolonged ileus  Patient Measurements: Height: 5\' 9"  (175.3 cm) Weight: 94.2 kg (207 lb 10.8 oz) IBW/kg (Calculated) : 70.7 TPN AdjBW (KG): 75.7 Body mass index is 30.67 kg/m.  Assessment: 56 y.o. male presenting with worsening abdominal pain. Pharmacy has been consulted for TPN.   Glucose / Insulin: BG <180, not on any SSI Electrolytes: WNL Renal: Scr <1 Hepatic: 1/14 WNL Intake / Output; MIVF: I/O +9.4L; LR @100  ml/hr GI Imaging: 1/2 CT abdomen: Acute uncomplicated diverticulitis involving the upper sigmoid Colon 1/18 CT abdomen: Interval increase in now small to moderate amount of intraperitoneal free air. Continued findings of sigmoid diverticulitis, with stable pneumoperitoneum and left mid abdominal gas/fluid collections as seen on earlier CT 1/19: Small to moderate pneumoperitoneum. There is no evidence of intestinal obstruction GI Surgeries / Procedures: 1/19 exploratory laparotomy  Central access: to be placed 01/24/21 TPN start date: 01/24/21  Nutritional Goals: Goal TPN rate is 100 mL/hr (provides 112.8 g of protein and 2160.01 kcals per day)  RD Assessment: Estimated Needs Total Energy Estimated Needs: 2000-2300kcal/day Total Protein Estimated Needs: 100-115g/day Total Fluid Estimated Needs: 2.2-2.5L/day  Current Nutrition:  NPO  Plan:  Start TPN at half goal rate at 35mL/hr at 1800 Electrolytes in TPN: Na 71mEq/L, K 86mEq/L (changed to allow adequate volume for tech to compound, can likely change back to 50 mEq/L when rate is advanced and if K WNL), Ca 50mEq/L, Mg 64mEq/L, and Phos 17mmol/L. Cl:Ac 1:1 Add standard MVI and trace elements to TPN Add thiamine 100 mg x 3 days (day 1/3) per dietician (via secure chat) Initiate Sensitive q4h SSI and adjust as needed  Reduce MIVF to 50 mL/hr at 1800 Monitor TPN labs on Mon/Thurs  Hunter [name] 01/24/2021,7:17 AM

## 2021-01-25 ENCOUNTER — Inpatient Hospital Stay: Payer: Self-pay

## 2021-01-25 LAB — COMPREHENSIVE METABOLIC PANEL
ALT: 24 U/L (ref 0–44)
AST: 24 U/L (ref 15–41)
Albumin: 2.3 g/dL — ABNORMAL LOW (ref 3.5–5.0)
Alkaline Phosphatase: 64 U/L (ref 38–126)
Anion gap: 9 (ref 5–15)
BUN: 20 mg/dL (ref 6–20)
CO2: 25 mmol/L (ref 22–32)
Calcium: 8.6 mg/dL — ABNORMAL LOW (ref 8.9–10.3)
Chloride: 105 mmol/L (ref 98–111)
Creatinine, Ser: 0.93 mg/dL (ref 0.61–1.24)
GFR, Estimated: >60 mL/min (ref >60)
Glucose, Bld: 114 mg/dL — ABNORMAL HIGH (ref 70–99)
Potassium: 4.4 mmol/L (ref 3.5–5.1)
Sodium: 139 mmol/L (ref 135–145)
Total Bilirubin: 0.7 mg/dL (ref 0.3–1.2)
Total Protein: 6 g/dL — ABNORMAL LOW (ref 6.5–8.1)

## 2021-01-25 LAB — GLUCOSE, CAPILLARY
Glucose-Capillary: 126 mg/dL — ABNORMAL HIGH (ref 70–99)
Glucose-Capillary: 117 mg/dL — ABNORMAL HIGH (ref 70–99)
Glucose-Capillary: 111 mg/dL — ABNORMAL HIGH (ref 70–99)
Glucose-Capillary: 120 mg/dL — ABNORMAL HIGH (ref 70–99)
Glucose-Capillary: 130 mg/dL — ABNORMAL HIGH (ref 70–99)

## 2021-01-25 LAB — CBC
HCT: 31.4 % — ABNORMAL LOW (ref 39.0–52.0)
Hemoglobin: 10.8 g/dL — ABNORMAL LOW (ref 13.0–17.0)
MCH: 31.6 pg (ref 26.0–34.0)
MCHC: 34.4 g/dL (ref 30.0–36.0)
MCV: 91.8 fL (ref 80.0–100.0)
Platelets: 320 10*3/uL (ref 150–400)
RBC: 3.42 MIL/uL — ABNORMAL LOW (ref 4.22–5.81)
RDW: 13.8 % (ref 11.5–15.5)
WBC: 11.7 10*3/uL — ABNORMAL HIGH (ref 4.0–10.5)
nRBC: 0.2 % (ref 0.0–0.2)

## 2021-01-25 LAB — MAGNESIUM: Magnesium: 2.3 mg/dL (ref 1.7–2.4)

## 2021-01-25 LAB — PHOSPHORUS: Phosphorus: 2.7 mg/dL (ref 2.5–4.6)

## 2021-01-25 LAB — TRIGLYCERIDES: Triglycerides: 240 mg/dL — ABNORMAL HIGH (ref <150)

## 2021-01-25 MED ORDER — PREGABALIN 50 MG PO CAPS
100.0000 mg | ORAL_CAPSULE | Freq: Three times a day (TID) | ORAL | Status: DC
  Administered 2021-01-25 – 2021-01-28 (×10): 100 mg via NASOGASTRIC
  Filled 2021-01-25 (×10): qty 2

## 2021-01-25 MED ORDER — TRAVASOL 10 % IV SOLN
INTRAVENOUS | Status: AC
  Filled 2021-01-25: qty 1128

## 2021-01-25 NOTE — Progress Notes (Signed)
PHARMACY - TOTAL PARENTERAL NUTRITION CONSULT NOTE   Indication: Prolonged ileus  Patient Measurements: Height: 5\' 9"  (175.3 cm) Weight: 94.2 kg (207 lb 10.8 oz) IBW/kg (Calculated) : 70.7 TPN AdjBW (KG): 75.7 Body mass index is 30.67 kg/m.  Assessment: 56 y.o. male presenting with worsening abdominal pain. Pharmacy has been consulted for TPN.   Glucose / Insulin: 24 hr insulin requirement: 2 units Electrolytes: WNL Renal: Scr <1 Hepatic: WNL Intake / Output; MIVF: I/O +8.5 L; LR @100  ml/hr GI Imaging: 1/2 CT abdomen: Acute uncomplicated diverticulitis involving the upper sigmoid Colon 1/18 CT abdomen: Interval increase in now small to moderate amount of intraperitoneal free air. Continued findings of sigmoid diverticulitis, with stable pneumoperitoneum and left mid abdominal gas/fluid collections as seen on earlier CT 1/19: Small to moderate pneumoperitoneum. There is no evidence of intestinal obstruction GI Surgeries / Procedures: 1/19 exploratory laparotomy  Central access: 01/24/21 TPN start date: 01/24/21  Nutritional Goals: Goal TPN rate is 100 mL/hr (provides 112.8 g of protein and 2160.01 kcals per day)  RD Assessment: Estimated Needs Total Energy Estimated Needs: 2000-2300kcal/day Total Protein Estimated Needs: 100-115g/day Total Fluid Estimated Needs: 2.2-2.5L/day  Current Nutrition:  NPO  Plan:  Increase TPN to goal rate 100 mL/hr at 1800 Electrolytes in TPN: Na 50 mEq/L, K 30 mEq/L, Ca 5 mEq/L, Mg 5 mEq/L, and Phos 10 mmol/L. Cl:Ac 1:1 Add standard MVI and trace elements to TPN Add thiamine 100 mg x 3 days (day 2/3) Initiate Sensitive q4h SSI and adjust as needed  Stop MIVF at 1800 Monitor TPN labs on Mon/Thurs  Tawnya Crook, PharmD, BCPS Clinical Pharmacist 01/25/2021 8:58 AM

## 2021-01-25 NOTE — Consult Note (Signed)
[place] Nurse ostomy consult note This Probation officer called patient's spouse to determine if we could arrange for a teaching session with her present on either Monday or Tuesday.  Patient informed this Probation officer that his wife works very close to [employer]. Patient is not progressing rapidly, nor are we anticipating imminent discharge, so there is no rush to accomplish this on Monday.  Voice Mail message left.  Awaiting return call.  [place] nursing team will follow, and will remain available to this patient, the nursing and medical teams.   Thanks, Maudie Flakes, MSN, RN, [place], Arther Abbott  Pager# [phone]

## 2021-01-25 NOTE — Progress Notes (Signed)
POD#2 s/p Hartmann's for perforated diverticulitis with abscess No flatus in colostomy Still distended Still sore AVSS NGT 650 Drain 90cc Kub w gas and contrast in the colon, ng in place Labs ok some drop in hb likely dilutional no signs of active bleeding  PE NAD Abd: soft but distended, Dressing intact. JP w serous, ostomy pink and patent. No output  A/P Post op ileus Keep ngt for today Mobilize Keep TPN Expected post op course

## 2021-01-26 LAB — BASIC METABOLIC PANEL
Anion gap: 8 (ref 5–15)
BUN: 15 mg/dL (ref 6–20)
CO2: 23 mmol/L (ref 22–32)
Calcium: 8.6 mg/dL — ABNORMAL LOW (ref 8.9–10.3)
Chloride: 105 mmol/L (ref 98–111)
Creatinine, Ser: 0.75 mg/dL (ref 0.61–1.24)
GFR, Estimated: >60 mL/min (ref >60)
Glucose, Bld: 141 mg/dL — ABNORMAL HIGH (ref 70–99)
Potassium: 4.4 mmol/L (ref 3.5–5.1)
Sodium: 136 mmol/L (ref 135–145)

## 2021-01-26 LAB — PHOSPHORUS: Phosphorus: 3.5 mg/dL (ref 2.5–4.6)

## 2021-01-26 LAB — GLUCOSE, CAPILLARY
Glucose-Capillary: 134 mg/dL — ABNORMAL HIGH (ref 70–99)
Glucose-Capillary: 135 mg/dL — ABNORMAL HIGH (ref 70–99)
Glucose-Capillary: 139 mg/dL — ABNORMAL HIGH (ref 70–99)
Glucose-Capillary: 142 mg/dL — ABNORMAL HIGH (ref 70–99)
Glucose-Capillary: 142 mg/dL — ABNORMAL HIGH (ref 70–99)
Glucose-Capillary: 143 mg/dL — ABNORMAL HIGH (ref 70–99)
Glucose-Capillary: 156 mg/dL — ABNORMAL HIGH (ref 70–99)

## 2021-01-26 LAB — MAGNESIUM: Magnesium: 2.1 mg/dL (ref 1.7–2.4)

## 2021-01-26 MED ORDER — TRAVASOL 10 % IV SOLN
INTRAVENOUS | Status: AC
  Filled 2021-01-26: qty 1128

## 2021-01-26 MED ORDER — HYDROMORPHONE HCL 1 MG/ML IJ SOLN
1.0000 mg | INTRAMUSCULAR | Status: DC | PRN
  Administered 2021-01-26 – 2021-01-30 (×24): 1 mg via INTRAVENOUS
  Filled 2021-01-26 (×24): qty 1

## 2021-01-26 MED ORDER — ACETAMINOPHEN 10 MG/ML IV SOLN
1000.0000 mg | Freq: Four times a day (QID) | INTRAVENOUS | Status: AC
  Administered 2021-01-26 – 2021-01-27 (×4): 1000 mg via INTRAVENOUS
  Filled 2021-01-26 (×4): qty 100

## 2021-01-26 NOTE — Consult Note (Signed)
[place] Nurse ostomy consult note Confirmed appointment with patient's wife for 11am on MON, 1/23. She would prefer teaching sessions after she gets out of work at 3:30pm, at 3:45pm.  She is available for just 1 hour for the planned session on Monday.  Teaching materials and supplies in room.  [place] nursing team will not follow, but will remain available to this patient, the nursing and medical teams.  Please re-consult if needed. Thanks, Maudie Flakes, MSN, RN, [place], Arther Abbott  Pager# [phone]

## 2021-01-26 NOTE — Progress Notes (Signed)
CC: s/p hartmanns Subjective: Ostomy w/o output Still w pain Labs ok Objective: Vital signs in last 24 hours: Temp:  [98.5 F (36.9 C)-99.6 F (37.6 C)] 98.8 F (37.1 C) (01/22 1923) Pulse Rate:  [71-76] 71 (01/22 2228) Resp:  [16-18] 16 (01/22 1923) BP: (155-168)/(95-104) 168/99 (01/22 2228) SpO2:  [95 %-100 %] 99 % (01/22 1923) Last BM Date: 01/22/21  Intake/Output from previous day: 01/21 0701 - 01/22 0700 In: 1730.1 [I.V.:1367.4; NG/GT:200; IV Piggyback:162.6] Out: 540 [Urine:300; Emesis/NG output:200; Drains:40] Intake/Output this shift: No intake/output data recorded.  Physical exam:  NAd alert Abd: soft, dressing intact, some distension, appropiate tenderness w/o peritonitis. Ostomy pink but no output  Lab Results: CBC  Recent Labs    01/24/21 0854 01/25/21 0506  WBC 12.7* 11.7*  HGB 12.3* 10.8*  HCT 36.3* 31.4*  PLT 338 320   BMET Recent Labs    01/25/21 0506 01/26/21 0812  NA 139 136  K 4.4 4.4  CL 105 105  CO2 25 23  GLUCOSE 114* 141*  BUN 20 15  CREATININE 0.93 0.75  CALCIUM 8.6* 8.6*   PT/INR No results for input(s): LABPROT, INR in the last 72 hours. ABG No results for input(s): PHART, HCO3 in the last 72 hours.  Invalid input(s): PCO2, PO2  Studies/Results: DG ABD ACUTE 2+V W 1V CHEST  Result Date: 01/25/2021 CLINICAL DATA:  Postop ileus EXAM: DG ABDOMEN ACUTE WITH 1 VIEW CHEST COMPARISON:  Abdominal x-ray 01/23/2021 FINDINGS: Enteric tube tip is in the stomach with the side port near the level of the diaphragm. Postsurgical staples over the midline and a surgical drainage catheter projecting over the lower abdomen/pelvis. No abnormally distended loops of bowel identified. No suspicious calcifications. IMPRESSION: 1. Postsurgical changes with no abnormally distended loops of bowel identified. 2. Enteric tube side port is near the level of the diaphragm, consider advancing 4-5 cm. Electronically Signed   By: Ofilia Neas M.D.   On:  01/25/2021 14:15    Anti-infectives: Anti-infectives (From admission, onward)    Start     Dose/Rate Route Frequency Ordered Stop   01/23/21 2111  piperacillin-tazobactam (ZOSYN) 3.375 GM/50ML IVPB       Note to Pharmacy: Callie Fielding C: cabinet override      01/23/21 2111 01/23/21 2114   01/23/21 1759  piperacillin-tazobactam (ZOSYN) 3.375 GM/50ML IVPB       Note to Pharmacy: Caralee Ates L: cabinet override      01/23/21 1759 01/23/21 1824   01/23/21 1100  piperacillin-tazobactam (ZOSYN) IVPB 3.375 g        3.375 g 12.5 mL/hr over 240 Minutes Intravenous Every 8 hours 01/23/21 0958     01/19/21 0300  piperacillin-tazobactam (ZOSYN) IVPB 3.375 g  Status:  Discontinued        3.375 g 12.5 mL/hr over 240 Minutes Intravenous Every 8 hours 01/18/21 1845 01/23/21 0958   01/18/21 1830  piperacillin-tazobactam (ZOSYN) IVPB 3.375 g        3.375 g 100 mL/hr over 30 Minutes Intravenous  Once 01/18/21 1821 01/18/21 1930       Assessment/Plan: Persitent ileus Continue TPN NGT Increase dilaudid and place schedule IV tylenol No complicaitons [place], MD, [facility]  01/26/2021

## 2021-01-26 NOTE — Progress Notes (Signed)
PHARMACY - TOTAL PARENTERAL NUTRITION CONSULT NOTE   Indication: Prolonged ileus  Patient Measurements: Height: Hunter\' 9"  (175.3 cm) Weight: 94.2 kg (207 lb 10.8 oz) IBW/kg (Calculated) : 70.7 TPN AdjBW (KG): 75.7 Body mass index is 30.67 kg/m.  Assessment: 56 y.o. [name] presenting with worsening abdominal pain. Pharmacy has been consulted for TPN.   Glucose / Insulin: 24 hr insulin requirement: 3 units Electrolytes: WNL Renal: Scr <1 Hepatic: WNL Intake / Output; MIVF: I/O +8.7 L GI Imaging: 1/2 CT abdomen: Acute uncomplicated diverticulitis involving the upper sigmoid Colon 1/18 CT abdomen: Interval increase in now small to moderate amount of intraperitoneal free air. Continued findings of sigmoid diverticulitis, with stable pneumoperitoneum and left mid abdominal gas/fluid collections as seen on earlier CT 1/19: Small to moderate pneumoperitoneum. There is no evidence of intestinal obstruction GI Surgeries / Procedures: 1/19 exploratory laparotomy  Central access: 01/24/21 TPN start date: 01/24/21  Nutritional Goals: Goal TPN rate is 100 mL/hr (provides 112.8 g of protein and 2160.01 kcals per day)  RD Assessment: Estimated Needs Total Energy Estimated Needs: 2000-2300kcal/day Total Protein Estimated Needs: 100-115g/day Total Fluid Estimated Needs: 2.2-2.5L/day  Current Nutrition:  NPO  Plan:  Continue TPN at goal rate 100 mL/hr Electrolytes in TPN: Na 50 mEq/L, K 30 mEq/L, Ca Hunter mEq/L, Mg Hunter mEq/L, and Phos 10 mmol/L. Cl:Ac 1:1 Add standard MVI and trace elements to TPN Add thiamine 100 mg x 3 days (day 3/3) Continue Sensitive q4h SSI and adjust as needed  Monitor TPN labs on Mon/Thurs at minimum  Tawnya Crook, PharmD, BCPS Clinical Pharmacist 01/26/2021 7:40 AM

## 2021-01-27 LAB — TRIGLYCERIDES: Triglycerides: 224 mg/dL — ABNORMAL HIGH (ref <150)

## 2021-01-27 LAB — CBC
HCT: 31.2 % — ABNORMAL LOW (ref 39.0–52.0)
Hemoglobin: 10.7 g/dL — ABNORMAL LOW (ref 13.0–17.0)
MCH: 31.8 pg (ref 26.0–34.0)
MCHC: 34.3 g/dL (ref 30.0–36.0)
MCV: 92.6 fL (ref 80.0–100.0)
Platelets: 335 10*3/uL (ref 150–400)
RBC: 3.37 MIL/uL — ABNORMAL LOW (ref 4.22–5.81)
RDW: 14.6 % (ref 11.5–15.5)
WBC: 14.7 10*3/uL — ABNORMAL HIGH (ref 4.0–10.5)
nRBC: 0 % (ref 0.0–0.2)

## 2021-01-27 LAB — COMPREHENSIVE METABOLIC PANEL
ALT: 53 U/L — ABNORMAL HIGH (ref 0–44)
AST: 40 U/L (ref 15–41)
Albumin: 2.3 g/dL — ABNORMAL LOW (ref 3.5–5.0)
Alkaline Phosphatase: 101 U/L (ref 38–126)
Anion gap: 6 (ref 5–15)
BUN: 14 mg/dL (ref 6–20)
CO2: 22 mmol/L (ref 22–32)
Calcium: 8.7 mg/dL — ABNORMAL LOW (ref 8.9–10.3)
Chloride: 106 mmol/L (ref 98–111)
Creatinine, Ser: 0.68 mg/dL (ref 0.61–1.24)
GFR, Estimated: >60 mL/min (ref >60)
Glucose, Bld: 139 mg/dL — ABNORMAL HIGH (ref 70–99)
Potassium: 4.4 mmol/L (ref 3.5–5.1)
Sodium: 134 mmol/L — ABNORMAL LOW (ref 135–145)
Total Bilirubin: 0.8 mg/dL (ref 0.3–1.2)
Total Protein: 6.5 g/dL (ref 6.5–8.1)

## 2021-01-27 LAB — SURGICAL PATHOLOGY

## 2021-01-27 LAB — GLUCOSE, CAPILLARY
Glucose-Capillary: 140 mg/dL — ABNORMAL HIGH (ref 70–99)
Glucose-Capillary: 141 mg/dL — ABNORMAL HIGH (ref 70–99)
Glucose-Capillary: 136 mg/dL — ABNORMAL HIGH (ref 70–99)
Glucose-Capillary: 125 mg/dL — ABNORMAL HIGH (ref 70–99)
Glucose-Capillary: 133 mg/dL — ABNORMAL HIGH (ref 70–99)
Glucose-Capillary: 123 mg/dL — ABNORMAL HIGH (ref 70–99)

## 2021-01-27 LAB — MAGNESIUM: Magnesium: 2.1 mg/dL (ref 1.7–2.4)

## 2021-01-27 LAB — PHOSPHORUS: Phosphorus: 3.4 mg/dL (ref 2.5–4.6)

## 2021-01-27 MED ORDER — LORAZEPAM 2 MG/ML IJ SOLN: 1.0000 mg | Freq: Once | INTRAMUSCULAR | Status: DC

## 2021-01-27 MED ORDER — INSULIN ASPART 100 UNIT/ML IJ SOLN
0.0000 [IU] | Freq: Four times a day (QID) | INTRAMUSCULAR | Status: DC
  Administered 2021-01-27 – 2021-01-29 (×6): 1 [IU] via SUBCUTANEOUS
  Administered 2021-01-29: 05:00:00 2 [IU] via SUBCUTANEOUS
  Administered 2021-01-29: 17:00:00 1 [IU] via SUBCUTANEOUS
  Administered 2021-01-29: 01:00:00 2 [IU] via SUBCUTANEOUS
  Administered 2021-01-29 – 2021-01-30 (×2): 1 [IU] via SUBCUTANEOUS
  Administered 2021-01-30 – 2021-01-31 (×3): 2 [IU] via SUBCUTANEOUS
  Administered 2021-01-31: 1 [IU] via SUBCUTANEOUS
  Filled 2021-01-27 (×17): qty 1

## 2021-01-27 MED ORDER — TRAVASOL 10 % IV SOLN
INTRAVENOUS | Status: AC
  Filled 2021-01-27: qty 1128

## 2021-01-27 NOTE — Progress Notes (Signed)
Pt refused to take ativan, encouraged to do so

## 2021-01-27 NOTE — Progress Notes (Signed)
PHARMACY - TOTAL PARENTERAL NUTRITION CONSULT NOTE   Indication: Prolonged ileus  Patient Measurements: Height: 5\' 9"  (175.3 cm) Weight: 94.2 kg (207 lb 10.8 oz) IBW/kg (Calculated) : 70.7 TPN AdjBW (KG): 75.7 Body mass index is 30.67 kg/m.  Assessment: 56 y.o. male presenting with worsening abdominal pain. Pharmacy has been consulted for TPN.   Glucose / Insulin: 24 hr insulin requirement: 6 units Electrolytes: WNL Renal: Scr <1, stable Hepatic: WNL Intake / Output; MIVF: I/O +8.7 L GI Imaging: 1/2 CT abdomen: Acute uncomplicated diverticulitis involving the upper sigmoid Colon 1/18 CT abdomen: Interval increase in now small to moderate amount of intraperitoneal free air. Continued findings of sigmoid diverticulitis, with stable pneumoperitoneum and left mid abdominal gas/fluid collections as seen on earlier CT 1/19: Small to moderate pneumoperitoneum. There is no evidence of intestinal obstruction GI Surgeries / Procedures: 1/19 exploratory laparotomy  Central access: 01/24/21 TPN start date: 01/24/21  Nutritional Goals: Goal TPN rate is 100 mL/hr (provides 112.8 g of protein and 2160.01 kcals per day)  RD Assessment: Estimated Needs Total Energy Estimated Needs: 2000-2300kcal/day Total Protein Estimated Needs: 100-115g/day Total Fluid Estimated Needs: 2.2-2.5L/day  Current Nutrition:  NPO  Plan:  Continue TPN at goal rate 100 mL/hr (total volume including overfill 2500 mL) Nutritional components: Amino acids: (as 10% Travasol): 112.8 grams Dextrose: 312 grams Lipids (as 20% SMOFlipids) 64.8 grams Electrolytes in TPN: Na 50 mEq/L, K 30 mEq/L, Ca 5 mEq/L, Mg 5 mEq/L, and Phos 10 mmol/L. Cl:Ac 1:1 Add standard MVI and trace elements to TPN Adjust SSI Sensitive to q6h SSI and adjust as needed  Monitor TPN labs on Mon/Thurs at minimum  Dallie Piles, PharmD, BCPS Clinical Pharmacist 01/27/2021 7:01 AM

## 2021-01-27 NOTE — Progress Notes (Signed)
[facility] Hospital Day(s): 9.   Post op day(s): 4 Days Post-Op.   Interval History:  Patient seen and examined Overnight had deep coughing episode and "felt something pop"  This morning, he reports that he is feeling okay; pain medications helping No fever, chills, nausea, emesis Renal function remains normal; sCr - 0.68; UO - 1.1L No electrolyte derangements Repeat CBC is pending this morning; added onto labs NGT output recorded at 700 ccs; clearing Surgical drain with 30 ccs out; serosanguinous Colostomy output recorded at 90 ccs; bowel sweat; no gas He is NPO on TPN He is OOB  Vital signs in last 24 hours: [min-max] current  Temp:  [98.5 F (36.9 C)-98.8 F (37.1 C)] 98.5 F (36.9 C) (01/23 0409) Pulse Rate:  [71-80] 80 (01/23 0735) Resp:  [16-20] 18 (01/23 0735) BP: (156-168)/(97-104) 163/98 (01/23 0735) SpO2:  [96 %-100 %] 96 % (01/23 0735)     Height: 5\' 9"  (175.3 cm) Weight: 94.2 kg BMI (Calculated): 30.65   Intake/Output last 2 shifts:  01/22 0701 - 01/23 0700 In: 5620.1 [I.V.:2815.9; IV Piggyback:2754.1] Out: 1970 [Urine:1150; Emesis/NG output:700; Drains:30; Stool:90]   Physical Exam:  Constitutional: alert, cooperative and no distress  HEENT: NGT in place; minimal output; clearing Respiratory: breathing non-labored at rest  Cardiovascular: regular rate and sinus rhythm  Gastrointestinal: soft, non-tender, and non-distended. Surgical drain in the RLQ with serosanguinous output. 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 and patent, minimal bowel sweat in bag, no gas or stool Integumentary: I did remove honeycomb this morning; staples and penrose ar intact, there is minimal serous drainage, some expected erythema  Labs:  CBC Latest Ref Rng & Units 01/25/2021 01/24/2021 01/23/2021  WBC 4.0 - 10.5 K/uL 11.7(H) 12.7(H) 9.8  Hemoglobin 13.0 - 17.0 g/dL 10.8(L) 12.3(L) 11.3(L)  Hematocrit 39.0 - 52.0 % 31.4(L) 36.3(L) 32.7(L)   Platelets 150 - 400 K/uL 320 338 295   CMP Latest Ref Rng & Units 01/27/2021 01/26/2021 01/25/2021  Glucose 70 - 99 mg/dL 139(H) 141(H) 114(H)  BUN 6 - 20 mg/dL 14 15 20   Creatinine 0.61 - 1.24 mg/dL 0.68 0.75 0.93  Sodium 135 - 145 mmol/L 134(L) 136 139  Potassium 3.5 - 5.1 mmol/L 4.4 4.4 4.4  Chloride 98 - 111 mmol/L 106 105 105  CO2 22 - 32 mmol/L 22 23 25   Calcium 8.9 - 10.3 mg/dL 8.7(L) 8.6(L) 8.6(L)  Total Protein 6.5 - 8.1 g/dL 6.5 - 6.0(L)  Total Bilirubin 0.3 - 1.2 mg/dL 0.8 - 0.7  Alkaline Phos 38 - 126 U/L 101 - 64  AST 15 - 41 U/L 40 - 24  ALT 0 - 44 U/L 53(H) - 24     Imaging studies: No new pertinent imaging studies   Assessment/Plan:  56 y.o. male 4 Days Post-Op s/p exploratory laparotomy, colectomy, and colostomy creation (Hartmann's Procedure) for perforated diverticulitis   - He did report feeling a "pop" with coughing fit overnight, some pain initially but this is improved. He had some serous drainage from wound but not a significant amount. I did remove the honeycomb and incision appears intact. I do not appreciate any significant amount of drainage. I suspect he may have popped some subcutaneous sutures, low suspicion for evisceration. I discussed with RN that should he have significant drainage from his midline wound, we may need to repeat CT.    - Continue NPO until signs of colostomy function return  - Continue TPN; at goal; monitor electrolytes  -  Continue NGT decompression; LIS; monitor and record output    - Continue IV Abx (Zosyn); reset clock given OR/perforation; Day 4  - Added repeat CBC to ensure no worsening leukocytosis  - Will engage [place] RN this morning  - Monitor abdominal examination  - Pain control prn; antiemetics  - Mobilization as tolerated; will engage PT  All of the above findings and recommendations were discussed with the patient, patient's family (wife), and the medical team, and all of patient's and family's questions were answered to  their expressed satisfaction.  -- Edison Simon, PA-C [facility] 01/27/2021, 8:03 AM [phone] M-F: 7am - 4pm

## 2021-01-27 NOTE — Evaluation (Signed)
Physical Therapy Evaluation Patient Details Name: Hunter [name] MRN: [id number] DOB: 08/03/1965 Today's Date: 01/27/2021  History of Present Illness  Patient is a 56 year old male who presents to [facility] with Diverticulitis of large intestine with perforation. Patient recieved surgery on 01/23/21.  Clinical Impression  Patient tolerated session well and was agreeable to treatment. Pain ranged from 4-6/10 throughout session, however RN administered Toradol at beginning of session. Patient reports being Mod I/ Independent prior to hospitalization, and works as a Nature conservation officer. Patient was able to demonstrate Mod I with BUE support from bed rails to complete sit<>supine transfers. Mild grimacing and pain in the abdomen when doing so. Sit to stands and gait were also able to be completed Mod I. No unsteadiness with gait, however cadence was significantly decreased. Recommend going over steps next session to ensure safety getting in/out of the home. Recommend no PT post discharge from acute hospitalization.      Recommendations for follow up therapy are one component of a multi-disciplinary discharge planning process, led by the attending physician.  Recommendations may be updated based on patient status, additional functional criteria and insurance authorization.  Follow Up Recommendations No PT follow up    Assistance Recommended at Discharge PRN  Patient can return home with the following  A little help with walking and/or transfers;Help with stairs or ramp for entrance    Equipment Recommendations None recommended by PT  Recommendations for Other Services       Functional Status Assessment Patient has had a recent decline in their functional status and demonstrates the ability to make significant improvements in function in a reasonable and predictable amount of time.     Precautions / Restrictions Precautions Precautions: Fall Restrictions Weight Bearing Restrictions: No       Mobility  Bed Mobility Overal bed mobility: Modified Independent (BUE support from bed rails to assist to sitting)                  Transfers Overall transfer level: Modified independent Equipment used: None                    Ambulation/Gait Ambulation/Gait assistance: Modified independent (Device/Increase time) Gait Distance (Feet): 600 Feet Assistive device: None Gait Pattern/deviations: Step-through pattern, Decreased step length - right, Decreased step length - left, Narrow base of support Gait velocity: decreased     General Gait Details: no unsteadiness noted, R hand resting on abdomen when walking, able to perform horizontal head turns and maintain balance  Stairs            Wheelchair Mobility    Modified Rankin (Stroke Patients Only)       Balance Overall balance assessment: Modified Independent                                           Pertinent Vitals/Pain Pain Assessment Pain Assessment: 0-10 Pain Score: 5  Breathing: normal Negative Vocalization: none Facial Expression: smiling or inexpressive Body Language: relaxed Pain Location: Abdomen Pain Descriptors / Indicators: Aching, Discomfort Pain Intervention(s): Limited activity within patient's tolerance, Monitored during session, Repositioned, RN gave pain meds during session (Toradol)    Home Living Family/patient expects to be discharged to:: Private residence Living Arrangements: Spouse/significant other Available Help at Discharge: Family (wife) Type of Home: Mobile home Home Access: Stairs to enter Entrance Stairs-Rails: Right;Left;Can reach both Technical brewer  of Steps: 3   Home Layout: One level Home Equipment: None      Prior Function Prior Level of Function : Independent/Modified Independent                     Hand Dominance   Dominant Hand: Right    Extremity/Trunk Assessment   Upper Extremity Assessment Upper  Extremity Assessment: Overall WFL for tasks assessed    Lower Extremity Assessment Lower Extremity Assessment: Overall WFL for tasks assessed    Cervical / Trunk Assessment Cervical / Trunk Assessment: Normal  Communication   Communication: No difficulties  Cognition Arousal/Alertness: Awake/alert Behavior During Therapy: WFL for tasks assessed/performed Overall Cognitive Status: Within Functional Limits for tasks assessed                                 General Comments: A&Ox4        General Comments      Exercises Other Exercises Other Exercises: patient educated on fall risk and role of PT   Assessment/Plan    PT Assessment Patient does not need any further PT services  PT Problem List Pain;Decreased activity tolerance       PT Treatment Interventions Stair training;Functional mobility training    PT Goals (Current goals can be found in the Care Plan section)  Acute Rehab PT Goals Patient Stated Goal: to go home PT Goal Formulation: With patient Time For Goal Achievement: 02/10/21 Potential to Achieve Goals: Good    Frequency Min 2X/week     Co-evaluation               AM-PAC PT "6 Clicks" Mobility  Outcome Measure Help needed turning from your back to your side while in a flat bed without using bedrails?: None Help needed moving from lying on your back to sitting on the side of a flat bed without using bedrails?: None Help needed moving to and from a bed to a chair (including a wheelchair)?: None Help needed standing up from a chair using your arms (e.g., wheelchair or bedside chair)?: None Help needed to walk in hospital room?: None Help needed climbing 3-5 steps with a railing? : None 6 Click Score: 24    End of Session Equipment Utilized During Treatment: Gait belt Activity Tolerance: Patient tolerated treatment well;No increased pain Patient left: in bed;with call bell/phone within reach Nurse Communication: Mobility  status PT Visit Diagnosis: Pain;Difficulty in walking, not elsewhere classified (R26.2) Pain - part of body:  (Abdomen)    Time: 0254-2706 PT Time Calculation (min) (ACUTE ONLY): 28 min   Charges:   PT Evaluation $PT Eval Low Complexity: 1 Low PT Treatments $Gait Training: 8-22 mins        Iva Boop, PT  01/27/21. 12:56 PM

## 2021-01-27 NOTE — Consult Note (Addendum)
[facility] Nurse ostomy consult note Stoma type/location: LUQ colostomy  Stomal assessment/size: 1 and 5/8 inches round, raised, edematous, red, lumen in center Peristomal assessment: Previously noted Medical adhesive related skin injury (MARSI) from 3-5 o'clock measuring 0.2cm wide at lateral adhesive border is beginning to heal, no further drainage, red and dry partial thickness areas. Treatment options for stomal/peristomal skin: skin barrier ring Output: 50cc pink liquid, Pt remains with NG in place. Ostomy pouching: 2pc. 2 and 3/4 inch pouching system  Kellie Simmering # 2 wafer, # 649 pouch, # [id number] barrier ring) Pt was able to apply barrier ring and 2 piece pouching system with minimal assistance, using a hand held mirror.  He was able to open and close the velcro independently.  Wife at the bedside watched the procedure and asked appropriate questions.  Answered patient questions.  Discussed pouching routines and ordering supplies  Enrolled patient in [place] program: Yes.   Pt does not have medical insurance and provided him with indigent program information from [place]; explained he will need to contact them after discharge and he verbalized understanding.    Supplies in room (5 of each, pouches, skin barrier rings and skin barriers, along with educational materials. [place] nursing team will follow, and will remain available to this patient, the nursing and medical teams.    Julien Girt MSN, RN, [place], [place], [place]      Revision History

## 2021-01-27 NOTE — TOC Progression Note (Signed)
Transition of Care [facility]) - Progression Note    Patient Details  Name: Hunter [name] MRN: [id number] Date of Birth: June 28, 1965  [facility] CM/SW Contact  Beverly Sessions, RN Phone Number: 01/27/2021, 2:26 PM  Clinical Narrative:    Patient still NPO, with NG tube and TPN.  TOC to follow up with patient to discuss charity home health services for ostomy         Expected Discharge Plan and Services                                                 Social Determinants of Health (SDOH) Interventions    Readmission Risk Interventions No flowsheet data found.

## 2021-01-27 NOTE — Progress Notes (Signed)
Dr pabon notified pt had a deep cough, felt something pop, stated to reinforce the dressing

## 2021-01-28 ENCOUNTER — Inpatient Hospital Stay: Payer: Self-pay

## 2021-01-28 ENCOUNTER — Encounter: Payer: Self-pay | Admitting: Surgery

## 2021-01-28 LAB — CBC
HCT: 31.6 % — ABNORMAL LOW (ref 39.0–52.0)
Hemoglobin: 10.7 g/dL — ABNORMAL LOW (ref 13.0–17.0)
MCH: 30.9 pg (ref 26.0–34.0)
MCHC: 33.9 g/dL (ref 30.0–36.0)
MCV: 91.3 fL (ref 80.0–100.0)
Platelets: 358 10*3/uL (ref 150–400)
RBC: 3.46 MIL/uL — ABNORMAL LOW (ref 4.22–5.81)
RDW: 14.6 % (ref 11.5–15.5)
WBC: 17.6 10*3/uL — ABNORMAL HIGH (ref 4.0–10.5)
nRBC: 0 % (ref 0.0–0.2)

## 2021-01-28 LAB — GLUCOSE, CAPILLARY
Glucose-Capillary: 132 mg/dL — ABNORMAL HIGH (ref 70–99)
Glucose-Capillary: 114 mg/dL — ABNORMAL HIGH (ref 70–99)
Glucose-Capillary: 133 mg/dL — ABNORMAL HIGH (ref 70–99)

## 2021-01-28 MED ORDER — CYCLOBENZAPRINE HCL 5 MG PO TABS
7.5000 mg | ORAL_TABLET | Freq: Three times a day (TID) | ORAL | Status: DC
  Administered 2021-01-28 – 2021-02-01 (×12): 7.5 mg via ORAL
  Filled 2021-01-28 (×14): qty 1.5

## 2021-01-28 MED ORDER — IOHEXOL 240 MG/ML SOLN
25.0000 mL | INTRAMUSCULAR | Status: AC
  Administered 2021-01-28 (×2): 25 mL via ORAL

## 2021-01-28 MED ORDER — LORAZEPAM 2 MG/ML IJ SOLN: 0.5000 mg | Freq: Four times a day (QID) | INTRAMUSCULAR | Status: DC | PRN

## 2021-01-28 MED ORDER — ACETAMINOPHEN 10 MG/ML IV SOLN
1000.0000 mg | Freq: Four times a day (QID) | INTRAVENOUS | Status: AC
  Administered 2021-01-28 – 2021-01-29 (×4): 1000 mg via INTRAVENOUS
  Filled 2021-01-28 (×4): qty 100

## 2021-01-28 MED ORDER — GABAPENTIN 100 MG PO CAPS
200.0000 mg | ORAL_CAPSULE | Freq: Three times a day (TID) | ORAL | Status: DC
  Administered 2021-01-28 – 2021-02-01 (×12): 200 mg via ORAL
  Filled 2021-01-28 (×13): qty 2

## 2021-01-28 MED ORDER — TRAVASOL 10 % IV SOLN
INTRAVENOUS | Status: AC
  Filled 2021-01-28: qty 1128

## 2021-01-28 MED ORDER — LORAZEPAM 2 MG/ML IJ SOLN
0.5000 mg | Freq: Once | INTRAMUSCULAR | Status: AC | PRN
  Administered 2021-01-28: 11:00:00 0.5 mg via INTRAVENOUS
  Filled 2021-01-28: qty 1

## 2021-01-28 MED ORDER — IOHEXOL 300 MG/ML  SOLN
100.0000 mL | Freq: Once | INTRAMUSCULAR | Status: AC | PRN
  Administered 2021-01-28: 11:00:00 100 mL via INTRAVENOUS

## 2021-01-28 NOTE — Progress Notes (Addendum)
[place] Hospital Day(s): 10.   Post op day(s): 5 Days Post-Op.   Interval History:  Patient seen and examined Overnight, he continued to have 8/10 pain which was not well controlled This morning, he still has some pain, lower incision  No fever, chills, nausea, emesis His leukocytosis is worse this morning NGT output recorded at 350 ccs; slowing  Surgical drain with 25 ccs out; serosanguinous Colostomy output recorded at 50 ccs; bowel sweat; no gas He is NPO on TPN He is OOB  Vital signs in last 24 hours: [min-max] current  Temp:  [98.3 F (36.8 C)-99.6 F (37.6 C)] 99 F (37.2 C) (01/24 0513) Pulse Rate:  [86-88] 88 (01/24 0513) Resp:  [17-20] 17 (01/24 0513) BP: (141-145)/(88-97) 145/91 (01/24 0513) SpO2:  [99 %-100 %] 99 % (01/24 0513)     Height: 5\' 9"  (175.3 cm) Weight: 94.2 kg BMI (Calculated): 30.65   Intake/Output last 2 shifts:  01/23 0701 - 01/24 0700 In: 2312.5 [I.V.:2172.9; IV Piggyback:139.6] Out: 925 [Urine:500; Emesis/NG output:350; Drains:25; Stool:50]   Physical Exam:  Constitutional: alert, cooperative and no distress  HEENT: NGT in place; minimal output; clearing Respiratory: breathing non-labored at rest  Cardiovascular: regular rate and sinus rhythm  Gastrointestinal: soft, non-tender, and non-distended. Surgical drain in the RLQ with serosanguinous output. 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 and patent, minimal bowel sweat in bag, no gas or stool Integumentary: Midline is intact with staples, there is serosanguinous drainage from the lower portion of the incision, penrose intact, the skin is mildly erythematous   Labs:  CBC Latest Ref Rng & Units 01/28/2021 01/27/2021 01/25/2021  WBC 4.0 - 10.5 K/uL 17.6(H) 14.7(H) 11.7(H)  Hemoglobin 13.0 - 17.0 g/dL 10.7(L) 10.7(L) 10.8(L)  Hematocrit 39.0 - 52.0 % 31.6(L) 31.2(L) 31.4(L)  Platelets 150 - 400 K/uL 358 335 320   CMP Latest Ref Rng & Units 01/27/2021  01/26/2021 01/25/2021  Glucose 70 - 99 mg/dL 139(H) 141(H) 114(H)  BUN 6 - 20 mg/dL 14 15 20   Creatinine 0.61 - 1.24 mg/dL 0.68 0.75 0.93  Sodium 135 - 145 mmol/L 134(L) 136 139  Potassium 3.5 - 5.1 mmol/L 4.4 4.4 4.4  Chloride 98 - 111 mmol/L 106 105 105  CO2 22 - 32 mmol/L 22 23 25   Calcium 8.9 - 10.3 mg/dL 8.7(L) 8.6(L) 8.6(L)  Total Protein 6.5 - 8.1 g/dL 6.5 - 6.0(L)  Total Bilirubin 0.3 - 1.2 mg/dL 0.8 - 0.7  Alkaline Phos 38 - 126 U/L 101 - 64  AST 15 - 41 U/L 40 - 24  ALT 0 - 44 U/L 53(H) - 24     Imaging studies: No new pertinent imaging studies   Assessment/Plan:  56 y.o. male 5 Days Post-Op s/p exploratory laparotomy, colectomy, and colostomy creation (Hartmann's Procedure) for perforated diverticulitis   - Will repeat CT Abdomen/Pelvis this morning to ensure no evidence of infection, nor evisceration given his increased pain and "popping" sensation.    - Continue NPO until signs of colostomy function return  - Continue TPN; at goal; monitor electrolytes  - Continue NGT decompression; LIS; monitor and record output    - Continue IV Abx (Zosyn); reset clock given OR/perforation; Day 5  - Will engage [facility] RN this morning  - Monitor abdominal examination  - Pain control prn; antiemetics  - Mobilization as tolerated; will engage PT  All of the above findings and recommendations were discussed with the patient, and the medical team, and all of  patient's and family's questions were answered to their expressed satisfaction.  -- Edison Simon, PA-C [facility] 01/28/2021, 7:51 AM [phone] M-F: 7am - 4pm

## 2021-01-28 NOTE — Plan of Care (Signed)

## 2021-01-28 NOTE — Anesthesia Postprocedure Evaluation (Signed)
Anesthesia Post Note  Patient: Hunter [name]  Procedure(s) Performed: EXPLORATORY LAPAROTOMY COLECTOMY WITH COLOSTOMY CREATION/HARTMANN PROCEDURE  Patient location during evaluation: PACU Anesthesia Type: General Level of consciousness: awake and alert Pain management: pain level controlled Vital Signs Assessment: post-procedure vital signs reviewed and stable Respiratory status: spontaneous breathing, nonlabored ventilation and respiratory function stable Cardiovascular status: blood pressure returned to baseline and stable Postop Assessment: no apparent nausea or vomiting Anesthetic complications: no   No notable events documented.   Last Vitals:  Vitals:   01/27/21 1952 01/28/21 0513  BP: (!) 141/88 (!) 145/91  Pulse: 87 88  Resp: 20 17  Temp: 37.6 C 37.2 C  SpO2: 99% 99%    Last Pain:  Vitals:   01/28/21 0546  TempSrc:   PainSc: [place]

## 2021-01-28 NOTE — Progress Notes (Signed)
01/28/21 5:00 pm  Came to see patient as he has been having worse abdominal pain today.  WBC was also more elevated so CT scan was done.  Images personally viewed.  There's no evidence of abscess, perforation, contrast extravasation or evisceration.  There are appropriate post-op changes intra-abdominally and some edema at the left abdominal wall where the ostomy is going through.  On exam, patient is in pain distress and reports burning sensation.  The skin has some minimal erythema.  A few staples were removed at the inferior portion and at periumbilical region and there was only serosanguinous fluid, without any purulence.  New dry gauze dressing was applied.  The ostomy bag was removed.  The stoma has some edematous mucosa, but without ischemia or necrosis.  The stoma was digitized and there was no obstruction, and the internal mucosa was healthy and pink.  Blake drain with serosanguinous fluid.  Will adjust some of his medications.  Keep IV dilaudid and toradol as they are currently.  D/C Lyrica and start gabapentin 200 mg TID.  Also start Flexeril 7.5 mg TID.  Continue IV tylenol.  Repeat labs tomorrow.  Olean Ree MD

## 2021-01-28 NOTE — Progress Notes (Signed)
Pt c/o 8-10/10 pain and pain not well controlled this shift. Dilaudid administered q2 hours with little to no relief, pt alert, VSS, midline dx C/D/I, colostomy 2 piece set-up, no stool at this time. Pt somewhat anxious about the pain and his condition. Call bell in reach, will monitor closely

## 2021-01-28 NOTE — Progress Notes (Signed)
PHARMACY - TOTAL PARENTERAL NUTRITION CONSULT NOTE   Indication: Prolonged ileus  Patient Measurements: Height: 5\' 9"  (175.3 cm) Weight: 94.2 kg (207 lb 10.8 oz) IBW/kg (Calculated) : 70.7 TPN AdjBW (KG): 75.7 Body mass index is 30.67 kg/m.  Assessment: 56 y.o. male presenting with worsening abdominal pain. Pharmacy has been consulted for TPN.   Glucose / Insulin: 24 hr insulin requirement: 5 units Electrolytes: WNL Renal: Scr <1, stable Hepatic: WNL Intake/Output last 2 shifts:  01/23 0701 - 01/24 0700 In: 2312.5 [I.V.:2172.9; IV Piggyback:139.6] Out: 49 [Urine:500; Emesis/NG output:350; Drains:25; Stool:50] GI Imaging: 1/2 CT abdomen: Acute uncomplicated diverticulitis involving the upper sigmoid Colon 1/18 CT abdomen: Interval increase in now small to moderate amount of intraperitoneal free air. Continued findings of sigmoid diverticulitis, with stable pneumoperitoneum and left mid abdominal gas/fluid collections as seen on earlier CT 1/19: Small to moderate pneumoperitoneum. There is no evidence of intestinal obstruction GI Surgeries / Procedures: 1/19 exploratory laparotomy  Central access: 01/24/21 TPN start date: 01/24/21  Nutritional Goals: Goal TPN rate is 100 mL/hr (provides 112.8 g of protein and 2160.01 kcals per day)  RD Assessment: Estimated Needs Total Energy Estimated Needs: 2000-2300kcal/day Total Protein Estimated Needs: 100-115g/day Total Fluid Estimated Needs: 2.2-2.5L/day  Current Nutrition:  NPO  Plan:  Continue TPN at goal rate 100 mL/hr (total volume including overfill 2500 mL) Nutritional components: Amino acids: (as 10% Travasol): 112.8 grams Dextrose: 312 grams Lipids (as 20% SMOFlipids) 64.8 grams Electrolytes in TPN: Na 50 mEq/L, K 30 mEq/L, Ca 5 mEq/L, Mg 5 mEq/L, and Phos 10 mmol/L. Cl:Ac 1:1 Add standard MVI and trace elements to TPN continue SSI Sensitive to q6h SSI and adjust as needed  Monitor TPN labs on Mon/Thurs at  minimum  Dallie Piles, PharmD, BCPS Clinical Pharmacist 01/28/2021 7:01 AM

## 2021-01-29 LAB — GLUCOSE, CAPILLARY
Glucose-Capillary: 153 mg/dL — ABNORMAL HIGH (ref 70–99)
Glucose-Capillary: 166 mg/dL — ABNORMAL HIGH (ref 70–99)
Glucose-Capillary: 157 mg/dL — ABNORMAL HIGH (ref 70–99)
Glucose-Capillary: 137 mg/dL — ABNORMAL HIGH (ref 70–99)
Glucose-Capillary: 133 mg/dL — ABNORMAL HIGH (ref 70–99)
Glucose-Capillary: 141 mg/dL — ABNORMAL HIGH (ref 70–99)

## 2021-01-29 LAB — CBC
HCT: 33.4 % — ABNORMAL LOW (ref 39.0–52.0)
Hemoglobin: 11.1 g/dL — ABNORMAL LOW (ref 13.0–17.0)
MCH: 30.5 pg (ref 26.0–34.0)
MCHC: 33.2 g/dL (ref 30.0–36.0)
MCV: 91.8 fL (ref 80.0–100.0)
Platelets: 378 10*3/uL (ref 150–400)
RBC: 3.64 MIL/uL — ABNORMAL LOW (ref 4.22–5.81)
RDW: 14.4 % (ref 11.5–15.5)
WBC: 19.1 10*3/uL — ABNORMAL HIGH (ref 4.0–10.5)
nRBC: 0 % (ref 0.0–0.2)

## 2021-01-29 MED ORDER — TRAVASOL 10 % IV SOLN
INTRAVENOUS | Status: AC
  Filled 2021-01-29: qty 1128

## 2021-01-29 MED ORDER — KETOROLAC TROMETHAMINE 30 MG/ML IJ SOLN
30.0000 mg | Freq: Four times a day (QID) | INTRAMUSCULAR | Status: DC
  Administered 2021-01-29 – 2021-02-01 (×14): 30 mg via INTRAVENOUS
  Filled 2021-01-29 (×14): qty 1

## 2021-01-29 NOTE — Progress Notes (Signed)
Physical Therapy Treatment Patient Details Name: Hunter [name] MRN: [id number] DOB: 08-Mar-1965 Today's Date: 01/29/2021   History of Present Illness Patient is a 56 year old male who presents to [facility] with Diverticulitis of large intestine with perforation. Patient recieved surgery on 01/23/21.    PT Comments    Patient tolerated session well and was agreeable to treatment. Pain at beginning of session reported at a 3-4/10 in the abdomen. At conclusion pain was reported at 5/10 in same spot. Patient was requesting pain medication- RN notified. Patient is demonstrating Mod I with all bed mobility, transfers, gait and stairs. Patient demonstrating ambulating around the nurses station, straight into ascending/descending 3 steps with BUE support, into to main hallway to look at construction out in the yard and back to his room with no reports of fatigue or noted unsteadiness. Patient was left with all needs met, sitting EOB eating his dinner. Current plan upon discharge remains appropriate.    Recommendations for follow up therapy are one component of a multi-disciplinary discharge planning process, led by the attending physician.  Recommendations may be updated based on patient status, additional functional criteria and insurance authorization.  Follow Up Recommendations  No PT follow up     Assistance Recommended at Discharge PRN  Patient can return home with the following A little help with walking and/or transfers;Help with stairs or ramp for entrance   Equipment Recommendations  None recommended by PT    Recommendations for Other Services       Precautions / Restrictions Precautions Precautions: Fall Restrictions Weight Bearing Restrictions: No     Mobility  Bed Mobility Overal bed mobility: Modified Independent                  Transfers Overall transfer level: Modified independent Equipment used: Rolling walker (2 wheels)                     Ambulation/Gait Ambulation/Gait assistance: Modified independent (Device/Increase time) Gait Distance (Feet): 700 Feet Assistive device: Rolling walker (2 wheels) Gait Pattern/deviations: Step-through pattern, Decreased step length - right, Decreased step length - left, Narrow base of support Gait velocity: decreased     General Gait Details: no unsteadiness noted, able to perform horizontal head turns and maintain balance   Stairs Stairs: Yes Stairs assistance: Modified independent (Device/Increase time) Stair Management: Two rails Number of Stairs: 3 General stair comments: no unsteadiness or LOB noted   Wheelchair Mobility    Modified Rankin (Stroke Patients Only)       Balance Overall balance assessment: Modified Independent                                          Cognition Arousal/Alertness: Awake/alert Behavior During Therapy: WFL for tasks assessed/performed Overall Cognitive Status: Within Functional Limits for tasks assessed                                 General Comments: A&Ox4        Exercises      General Comments        Pertinent Vitals/Pain Pain Assessment Pain Assessment: No/denies pain Pain Score: 4  Pain Location: Abdomen Pain Descriptors / Indicators: Aching, Discomfort Pain Intervention(s): Limited activity within patient's tolerance, Monitored during session, Repositioned    Home Living  Prior Function            PT Goals (current goals can now be found in the care plan section) Acute Rehab PT Goals Patient Stated Goal: to go home PT Goal Formulation: With patient Time For Goal Achievement: 02/10/21 Potential to Achieve Goals: Good Progress towards PT goals: Progressing toward goals    Frequency    Min 2X/week      PT Plan Current plan remains appropriate    Co-evaluation              AM-PAC PT "6 Clicks" Mobility   Outcome Measure   Help needed turning from your back to your side while in a flat bed without using bedrails?: None Help needed moving from lying on your back to sitting on the side of a flat bed without using bedrails?: None Help needed moving to and from a bed to a chair (including a wheelchair)?: None Help needed standing up from a chair using your arms (e.g., wheelchair or bedside chair)?: None Help needed to walk in hospital room?: None Help needed climbing 3-5 steps with a railing? : None 6 Click Score: 24    End of Session Equipment Utilized During Treatment: Gait belt Activity Tolerance: Patient tolerated treatment well;No increased pain Patient left: in bed;with call bell/phone within reach (sitting EOB eating dinner) Nurse Communication: Mobility status;Patient requests pain meds PT Visit Diagnosis: Pain;Difficulty in walking, not elsewhere classified (R26.2)     Time: 9937-1696 PT Time Calculation (min) (ACUTE ONLY): 26 min  Charges:  $Gait Training: 8-22 mins $Therapeutic Activity: 8-22 mins                     Iva Boop, PT  01/29/21. 3:21 PM
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
## 2021-01-29 NOTE — Progress Notes (Signed)
PHARMACY - TOTAL PARENTERAL NUTRITION CONSULT NOTE   Indication: Prolonged ileus  Patient Measurements: Height: 5\' 9"  (175.3 cm) Weight: 89.7 kg (197 lb 12 oz) IBW/kg (Calculated) : 70.7 TPN AdjBW (KG): 75.7 Body mass index is 29.2 kg/m.  Assessment: 56 y.o. male presenting with worsening abdominal pain. Pharmacy has been consulted for TPN.   Glucose / Insulin: 24 hr insulin requirement: 5 units Electrolytes: WNL Renal: Scr <1, stable Hepatic: WNL Intake/Output last 2 shifts:  01/24 0701 - 01/25 0700 In: 2450.4 [I.V.:2400.4; IV Piggyback:50] Out: 3588 [Urine:2725; Emesis/NG output:300; Drains:13; Stool:550]  GI Imaging: 1/2 CT abdomen: Acute uncomplicated diverticulitis involving the upper sigmoid Colon 1/18 CT abdomen: Interval increase in now small to moderate amount of intraperitoneal free air. Continued findings of sigmoid diverticulitis, with stable pneumoperitoneum and left mid abdominal gas/fluid collections as seen on earlier CT 1/19: Small to moderate pneumoperitoneum. There is no evidence of intestinal obstruction GI Surgeries / Procedures: 1/19 exploratory laparotomy  Central access: 01/24/21 TPN start date: 01/24/21  Nutritional Goals: Goal TPN rate is 100 mL/hr (provides 112.8 g of protein and 2160.01 kcals per day)  RD Assessment: Estimated Needs Total Energy Estimated Needs: 2000-2300kcal/day Total Protein Estimated Needs: 100-115g/day Total Fluid Estimated Needs: 2.2-2.5L/day  Current Nutrition:  NPO  Plan:  Continue TPN at goal rate 100 mL/hr (total volume including overfill 2500 mL) Nutritional components: Amino acids: (as 10% Travasol): 112.8 grams Dextrose: 312 grams Lipids (as 20% SMOFlipids) 64.8 grams Electrolytes in TPN: Na 50 mEq/L, K 30 mEq/L, Ca 5 mEq/L, Mg 5 mEq/L, and Phos 10 mmol/L. Cl:Ac 1:1 Add standard MVI and trace elements to TPN continue SSI Sensitive to q6h SSI and adjust as needed  Monitor TPN labs on Mon/Thurs at  minimum  Dallie Piles, PharmD, BCPS Clinical Pharmacist 01/29/2021 7:00 AM

## 2021-01-29 NOTE — Progress Notes (Signed)
[place] Hospital Day(s): 11.   Post op day(s): 6 Days Post-Op.   Interval History:  Patient seen and examined This morning, he is feeling somewhat better; still incisional soreness No fever, chills, nausea, emesis His leukocytosis is worse this morning; 19.1K NGT output recorded at 300 ccs; slowing  Surgical drain with 13 ccs out; serosanguinous Colostomy output recorded at 550 ccs; liquid stool  He is NPO on TPN He is OOB  Vital signs in last 24 hours: [min-max] current  Temp:  [98.3 F (36.8 C)-100.3 F (37.9 C)] 99.8 F (37.7 C) (01/25 0405) Pulse Rate:  [84-99] 84 (01/25 0405) Resp:  [18-19] 18 (01/25 0405) BP: (122-157)/(70-85) 147/81 (01/25 0405) SpO2:  [99 %-100 %] 100 % (01/25 0405) Weight:  [89.7 kg] 89.7 kg (01/25 0447)     Height: 5\' 9"  (175.3 cm) Weight: 89.7 kg BMI (Calculated): 29.19   Intake/Output last 2 shifts:  01/24 0701 - 01/25 0700 In: 2450.4 [I.V.:2400.4; IV Piggyback:50] Out: 8315 [Urine:2725; Emesis/NG output:300; Drains:13; Stool:550]   Physical Exam:  Constitutional: alert, cooperative and no distress  HEENT: NGT in place; minimal output; clearing Respiratory: breathing non-labored at rest  Cardiovascular: regular rate and sinus rhythm  Gastrointestinal: soft, non-tender, and non-distended. Surgical drain in the RLQ with serosanguinous output. 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 and patent, minimal bowel sweat in bag, no gas or stool Integumentary: Midline is intact with staples, there is serosanguinous drainage from removed staples, no purulence, penrose intact, the skin is mildly erythematous   Labs:  CBC Latest Ref Rng & Units 01/29/2021 01/28/2021 01/27/2021  WBC 4.0 - 10.5 K/uL 19.1(H) 17.6(H) 14.7(H)  Hemoglobin 13.0 - 17.0 g/dL 11.1(L) 10.7(L) 10.7(L)  Hematocrit 39.0 - 52.0 % 33.4(L) 31.6(L) 31.2(L)  Platelets 150 - 400 K/uL 378 358 335   CMP Latest Ref Rng & Units 01/27/2021 01/26/2021  01/25/2021  Glucose 70 - 99 mg/dL 139(H) 141(H) 114(H)  BUN 6 - 20 mg/dL 14 15 20   Creatinine 0.61 - 1.24 mg/dL 0.68 0.75 0.93  Sodium 135 - 145 mmol/L 134(L) 136 139  Potassium 3.5 - 5.1 mmol/L 4.4 4.4 4.4  Chloride 98 - 111 mmol/L 106 105 105  CO2 22 - 32 mmol/L 22 23 25   Calcium 8.9 - 10.3 mg/dL 8.7(L) 8.6(L) 8.6(L)  Total Protein 6.5 - 8.1 g/dL 6.5 - 6.0(L)  Total Bilirubin 0.3 - 1.2 mg/dL 0.8 - 0.7  Alkaline Phos 38 - 126 U/L 101 - 64  AST 15 - 41 U/L 40 - 24  ALT 0 - 44 U/L 53(H) - 24     Imaging studies: No new pertinent imaging studies   Assessment/Plan:  56 y.o. male 6 Days Post-Op s/p exploratory laparotomy, colectomy, and colostomy creation (Hartmann's Procedure) for perforated diverticulitis   - Will clamp NGT this morning. After 4 hours, we will check residuals. If residuals are <150 cc, we can remove this and initiate diet.   - Continue NPO pending NGT clamping trial  - Continue TPN; at goal; monitor electrolytes   - Continue IV Abx (Zosyn); reset clock given OR/perforation; Day 6  - WOC RN following  - Monitor abdominal examination  - Pain control prn (multimodal); antiemetics  - Mobilization as tolerated; PT following; no needs identified   All of the above findings and recommendations were discussed with the patient, and the medical team, and all of patient's and family's questions were answered to their expressed satisfaction.  -- Edison Simon, PA-C [facility] 01/29/2021, 8:09 AM [phone] M-F: 7am - 4pm

## 2021-01-29 NOTE — Progress Notes (Signed)
Nutrition Follow Up Note   DOCUMENTATION CODES:   Not applicable  INTERVENTION:   TPN per pharmacy- currently at goal rate  Daily weights   Ensure Enlive po TID with diet advancement, each supplement provides 350 kcal and 20 grams of protein  NUTRITION DIAGNOSIS:   Inadequate oral intake related to acute illness as evidenced by NPO status.  GOAL:   Patient will meet greater than or equal to 90% of their needs -met with TPN    MONITOR:   PO intake, Diet advancement, Labs, Weight trends, Skin, I & O's, TPN  ASSESSMENT:   56 y/o male with h/o substance abuse who is admitted with perforated diverticulitis.  Pt s/p Hartmann's with end colostomy 1/20  Pt tolerating TPN well at goal rate. Refeed labs stable. CT scan from 1/24 with no acute findings. Pt reports soreness around his incision and ostomy. Pt is having ostomy output. NGT in place with 336m output. Plan is for clamp trial today. Pt initiated on clear liquids. Drains with 156moutput. Per chart, pt appears to be at his UBW currently. RD will add Ensure once pt advances to full liquids.    Medications reviewed and include: lovenox, insulin, protonix, zosyn   Labs reviewed: Na 134(L), K 4.4 wnl, P 3.4 wnl, Mg 2.1 wnl Triglycerides- 224(H) Wbc- 19.1(H) Cbgs- 137, 166, 153, 157 x 24 hrs  Diet Order:    Diet Order             Diet clear liquid Room service appropriate? Yes; Fluid consistency: Thin  Diet effective now                  EDUCATION NEEDS:   Education needs have been addressed  Skin:  Skin Assessment: Reviewed RN Assessment  Last BM:  1/25- 50042mia ostomy  Height:   Ht Readings from Last 1 Encounters:  01/18/21 5' 9"  (1.753 m)    Weight:   Wt Readings from Last 1 Encounters:  01/29/21 89.7 kg    Ideal Body Weight:  72.7 kg  BMI:  Body mass index is 29.2 kg/m.  Estimated Nutritional Needs:   Kcal:  2000-2300kcal/day  Protein:  100-115g/day  Fluid:  2.2-2.5L/day  CasKoleen Distance, RD, LDN Please refer to [facility] and/or RD on-call/weekend/after hours pager

## 2021-01-30 LAB — CBC
HCT: 32.1 % — ABNORMAL LOW (ref 39.0–52.0)
Hemoglobin: 10.8 g/dL — ABNORMAL LOW (ref 13.0–17.0)
MCH: 30.7 pg (ref 26.0–34.0)
MCHC: 33.6 g/dL (ref 30.0–36.0)
MCV: 91.2 fL (ref 80.0–100.0)
Platelets: 388 10*3/uL (ref 150–400)
RBC: 3.52 MIL/uL — ABNORMAL LOW (ref 4.22–5.81)
RDW: 14.5 % (ref 11.5–15.5)
WBC: 16.1 10*3/uL — ABNORMAL HIGH (ref 4.0–10.5)
nRBC: 0 % (ref 0.0–0.2)

## 2021-01-30 LAB — PHOSPHORUS: Phosphorus: 3.2 mg/dL (ref 2.5–4.6)

## 2021-01-30 LAB — COMPREHENSIVE METABOLIC PANEL
ALT: 92 U/L — ABNORMAL HIGH (ref 0–44)
AST: 41 U/L (ref 15–41)
Albumin: 2.4 g/dL — ABNORMAL LOW (ref 3.5–5.0)
Alkaline Phosphatase: 136 U/L — ABNORMAL HIGH (ref 38–126)
Anion gap: 6 (ref 5–15)
BUN: 14 mg/dL (ref 6–20)
CO2: 23 mmol/L (ref 22–32)
Calcium: 8.8 mg/dL — ABNORMAL LOW (ref 8.9–10.3)
Chloride: 101 mmol/L (ref 98–111)
Creatinine, Ser: 0.7 mg/dL (ref 0.61–1.24)
GFR, Estimated: >60 mL/min (ref >60)
Glucose, Bld: 153 mg/dL — ABNORMAL HIGH (ref 70–99)
Potassium: 4.6 mmol/L (ref 3.5–5.1)
Sodium: 130 mmol/L — ABNORMAL LOW (ref 135–145)
Total Bilirubin: 0.6 mg/dL (ref 0.3–1.2)
Total Protein: 6.7 g/dL (ref 6.5–8.1)

## 2021-01-30 LAB — GLUCOSE, CAPILLARY
Glucose-Capillary: 149 mg/dL — ABNORMAL HIGH (ref 70–99)
Glucose-Capillary: 166 mg/dL — ABNORMAL HIGH (ref 70–99)
Glucose-Capillary: 153 mg/dL — ABNORMAL HIGH (ref 70–99)

## 2021-01-30 LAB — MAGNESIUM: Magnesium: 2.2 mg/dL (ref 1.7–2.4)

## 2021-01-30 LAB — TRIGLYCERIDES: Triglycerides: 201 mg/dL — ABNORMAL HIGH (ref <150)

## 2021-01-30 MED ORDER — OXYCODONE HCL 5 MG PO TABS
5.0000 mg | ORAL_TABLET | ORAL | Status: DC | PRN
  Administered 2021-01-30 – 2021-02-01 (×6): 10 mg via ORAL
  Filled 2021-01-30 (×6): qty 2

## 2021-01-30 MED ORDER — HYDROMORPHONE HCL 1 MG/ML IJ SOLN
0.5000 mg | INTRAMUSCULAR | Status: DC | PRN
  Administered 2021-01-30 – 2021-01-31 (×3): 0.5 mg via INTRAVENOUS
  Filled 2021-01-30 (×3): qty 0.5

## 2021-01-30 MED ORDER — TRAVASOL 10 % IV SOLN
INTRAVENOUS | Status: AC
  Filled 2021-01-30: qty 1128

## 2021-01-30 NOTE — Progress Notes (Signed)
Pt complained of increased pain 7.5-8 /10 this shift.  He also had  a Temp of 100.5 this AM. Dilaudid 1 mg and scheduled Toradol admin.this AM and Temp down to 98.8 on recheck. Pt dressing changed this AM, drainage on dressing had foul odor. Dressing to  open area on incision packed wet to dry. No drainage seen from [place] rose drains but pinky white tinged drainage seen coming from top and bottom of incision at staples. Pt noted that area around incision very tender.

## 2021-01-30 NOTE — TOC Progression Note (Signed)
Transition of Care [facility]) - Progression Note    Patient Details  Name: Hunter [name] MRN: [id number] Date of Birth: April 23, 1965  Transition of Care [facility]) CM/SW Contact  Beverly Sessions, RN Phone Number: 01/30/2021, 8:57 AM  Clinical Narrative:     Per [place] with [facility] they can accept patient for charity home health if - TPN discontinued prior to DC - Teachable caregiver in the home.  Patient lives with wife. Patient also participating in teach back  - Patient is discharge home with some supplies and will order his own supplies as home health will not be able to provide. Per [place] note patient has been enrolled in the [place] secure start program, and given information on indigent program   Expected Discharge Plan: [place] Barriers to Discharge: Continued Medical Work up  Expected Discharge Plan and Services Expected Discharge Plan: [place]   Discharge Planning Services: CM Consult Post Acute Care Choice: [place] arrangements for the past 2 months: [place]: RN [facility] Agency: [place] Date [place]: 01/29/21   Representative spoke with at [place]: [place]   Social Determinants of Health ([place]) Interventions    Readmission Risk Interventions No flowsheet data found.

## 2021-01-30 NOTE — TOC Progression Note (Signed)
Transition of Care [facility]) - Progression Note    Patient Details  Name: Hunter [name] MRN: [id number] Date of Birth: December 19, 1965  Transition of Care [facility]) CM/SW Contact  Beverly Sessions, RN Phone Number: 01/30/2021, 2:32 PM  Clinical Narrative:     Per [place] with [place] patient has been approved for [facility] home health   Expected Discharge Plan: [place] Barriers to Discharge: Continued Medical Work up  Expected Discharge Plan and Services Expected Discharge Plan: [place]   Discharge Planning Services: CM Consult Post Acute Care Choice: [place] arrangements for the past 2 months: [place]: RN [facility] Agency: [place] Date [place]: 01/29/21   Representative spoke with at [place]: [place]   Social Determinants of Health ([place]) Interventions    Readmission Risk Interventions No flowsheet data found.

## 2021-01-30 NOTE — Progress Notes (Signed)
PHARMACY - TOTAL PARENTERAL NUTRITION CONSULT NOTE   Indication: Prolonged ileus  Patient Measurements: Height: 5\' 9"  (175.3 cm) Weight: 88.7 kg (195 lb 8.8 oz) IBW/kg (Calculated) : 70.7 TPN AdjBW (KG): 75.7 Body mass index is 28.88 kg/m.  Assessment: 56 y.o. male presenting with worsening abdominal pain. Pharmacy has been consulted for TPN.   Glucose / Insulin: 24 hr insulin requirement: 4 units Electrolytes: Hyponatremia Na: 134>130 Renal: Scr <1, stable Hepatic: ALT: 53>92, Triglycerides: 240>>201 Intake/Output last 2 shifts:  01/25 0701 - 01/26 0700 In: 120 [P.O.:120] Out: 1953 [Urine:1450; Drains: 3; Stool: 500]  GI Imaging: 1/2 CT abdomen: Acute uncomplicated diverticulitis involving the upper sigmoid Colon 1/18 CT abdomen: Interval increase in now small to moderate amount of intraperitoneal free air. Continued findings of sigmoid diverticulitis, with stable pneumoperitoneum and left mid abdominal gas/fluid collections as seen on earlier CT 1/19: Small to moderate pneumoperitoneum. There is no evidence of intestinal obstruction GI Surgeries / Procedures: 1/19 exploratory laparotomy  Central access: 01/24/21 TPN start date: 01/24/21  Nutritional Goals: Goal TPN rate is 100 mL/hr (provides 112.8 g of protein and 2160.01 kcals per day)  RD Assessment: Estimated Needs Total Energy Estimated Needs: 2000-2300kcal/day Total Protein Estimated Needs: 100-115g/day Total Fluid Estimated Needs: 2.2-2.5L/day  Current Nutrition:  NPO  Plan:  Continue TPN at goal rate 100 mL/hr (total volume including overfill 2500 mL) Nutritional components: Amino acids: (as 10% Travasol): 112.8 grams Dextrose: 312 grams Lipids (as 20% SMOFlipids) 64.8 grams Total kCal: 2160  Electrolytes in TPN: Na 100 mEq/L (changed from 50 mEq/L), K 30 mEq/L, Ca 5 mEq/L, Mg 5 mEq/L, and Phos 10 mmol/L. Cl:Ac 1:1 Add standard MVI and trace elements to TPN continue SSI Sensitive to q6h SSI and adjust  as needed  Monitor TPN labs on Mon/Thurs at minimum  Owens Loffler, PharmD Candidate 01/30/2021 8:26 AM

## 2021-01-30 NOTE — Consult Note (Addendum)
[place] Nurse ostomy follow up Surgical team following for assessment and plan of care for abd wound.   Pt states he and his wife changed the ostomy pouch last evening without assistance.  Current pouch is intact with good seal.  Small amt liquid brown stool, large amt flatus.  Pt was able to open and close velcro to empty without assistance. Stoma type/location: Stoma is red and viable, above skin level when visualized through pouch. 5 sets of barrier rings/wafers/pouches at bedside, along with educational materials. Education provided: Pt denies further questions. Reviewed pouching routines and ordering supplies after discharge. Enrolled patient in [place] Start Discharge program: Yes previously and indigent program information provided. Julien Girt MSN, RN, [place], [place], [place]

## 2021-01-30 NOTE — Progress Notes (Signed)
Physical Therapy Treatment Patient Details Name: Hunter [name] MRN: [id number] DOB: [birth year] Today's Date: 01/30/2021   History of Present Illness Patient is a 56 year old male who presents to [facility] with Diverticulitis of large intestine with perforation. Patient recieved surgery on 01/23/21.    PT Comments    Pt seen for continued functional mobility training and education on importance of getting out of bed daily. Pt able to get to edge of bed with supervision and use of bed rail. Transfers and gait 153ft with RW and supervision. Pt appears functionally ready for d/c home with assistance from spouse. Will continue PT per POC until then.   Recommendations for follow up therapy are one component of a multi-disciplinary discharge planning process, led by the attending physician.  Recommendations may be updated based on patient status, additional functional criteria and insurance authorization.  Follow Up Recommendations  No PT follow up     Assistance Recommended at Discharge PRN  Patient can return home with the following A little help with walking and/or transfers;Help with stairs or ramp for entrance   Equipment Recommendations  None recommended by PT    Recommendations for Other Services       Precautions / Restrictions Precautions Precautions: Fall Restrictions Weight Bearing Restrictions: No     Mobility  Bed Mobility Overal bed mobility: Modified Independent             General bed mobility comments:  (required bed rail)    Transfers Overall transfer level: Modified independent Equipment used: Rolling walker (2 wheels)                    Ambulation/Gait Ambulation/Gait assistance: Modified independent (Device/Increase time) Gait Distance (Feet): 175 Feet Assistive device: Rolling walker (2 wheels) Gait Pattern/deviations: WFL(Within Functional Limits) Gait velocity: decreased     General Gait Details: no unsteadiness noted, able to perform  horizontal head turns and maintain balance   Stairs             Wheelchair Mobility    Modified Rankin (Stroke Patients Only)       Balance                                            Cognition Arousal/Alertness: Awake/alert Behavior During Therapy: WFL for tasks assessed/performed Overall Cognitive Status: Within Functional Limits for tasks assessed                                 General Comments: A&Ox4        Exercises General Exercises - Lower Extremity Ankle Circles/Pumps: AROM, Both, 15 reps Long Arc Quad: AROM, Both, 10 reps    General Comments General comments (skin integrity, edema, etc.): Pt educated on importance of frequent mobility and completing independent HEP with good understandin.      Pertinent Vitals/Pain Pain Assessment Pain Assessment: 0-10 Pain Score: 4  Breathing: normal Negative Vocalization: none Facial Expression: smiling or inexpressive Body Language: relaxed Consolability: no need to console PAINAD Score: 0 Pain Location: Abdomen Pain Descriptors / Indicators: Aching, Discomfort Pain Intervention(s): Premedicated before session, Monitored during session    Home Living                          Prior Function  PT Goals (current goals can now be found in the care plan section) Acute Rehab PT Goals Patient Stated Goal: to go home    Frequency    Min 2X/week      PT Plan Current plan remains appropriate    Co-evaluation              AM-PAC PT "6 Clicks" Mobility   Outcome Measure  Help needed turning from your back to your side while in a flat bed without using bedrails?: None Help needed moving from lying on your back to sitting on the side of a flat bed without using bedrails?: None Help needed moving to and from a bed to a chair (including a wheelchair)?: None Help needed standing up from a chair using your arms (e.g., wheelchair or bedside chair)?:  None Help needed to walk in hospital room?: None Help needed climbing 3-5 steps with a railing? : None 6 Click Score: 24    End of Session Equipment Utilized During Treatment: Gait belt Activity Tolerance: Patient tolerated treatment well;No increased pain Patient left: in bed;with call bell/phone within reach Nurse Communication: Mobility status;Patient requests pain meds PT Visit Diagnosis: Pain;Difficulty in walking, not elsewhere classified (R26.2) Pain - part of body:  (Abdomen)     Time: 4008-6761 PT Time Calculation (min) (ACUTE ONLY): 28 min  Charges:  $Gait Training: 8-22 mins $Therapeutic Exercise: 8-22 mins                    Hunter [name], PTA    Hunter [name] 01/30/2021, 1:01 PM

## 2021-01-30 NOTE — Progress Notes (Signed)
[place] Hospital Day(s): 12.   Post op day(s): 7 Days Post-Op.   Interval History:  Patient seen and examined Overnight, he did have a fever with T-max 100.13F at 0430 This morning, he Is afebrile.  No fever, chills, nausea, emesis His leukocytosis is improved this morning; 16.1K (from 19.1) Renal function remains normal; sCr - 0.70; UO - 1.4L Mild hyponatremia to 130 o/w no significant electrolyte derangements NGT removed yesterday (01/25) after passing clamping trial Surgical drain output not recorded; serosanguinous Colostomy output not recorded either; liquid stool + gas He is on CLD + TPN at full rate He is OOB  Vital signs in last 24 hours: [min-max] current  Temp:  [97.9 F (36.6 C)-100.5 F (38.1 C)] 98.6 F (37 C) (01/26 0731) Pulse Rate:  [88-95] 89 (01/26 0731) Resp:  [16-20] 18 (01/26 0731) BP: (129-144)/(76-86) 132/76 (01/26 0731) SpO2:  [97 %-99 %] 98 % (01/26 0731) Weight:  [88.7 kg] 88.7 kg (01/26 0617)     Height: 5\' 9"  (175.3 cm) Weight: 88.7 kg BMI (Calculated): 28.86   Intake/Output last 2 shifts:  01/25 0701 - 01/26 0700 In: 120 [P.O.:120] Out: 1450 [Urine:1450]   Physical Exam:  Constitutional: alert, cooperative and no distress  Respiratory: breathing non-labored at rest  Cardiovascular: regular rate and sinus rhythm  Gastrointestinal: soft, incisional soreness, and non-distended. Surgical drain in the RLQ with serosanguinous output. 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 and patent, gas and liquid stool present Integumentary: Midline is intact with staples, there is serosanguinous drainage mixed with likely liquefied fat tissue from removed staples in inferior portion, no purulence, penrose intact, the skin is mildly erythematous   Labs:  CBC Latest Ref Rng & Units 01/30/2021 01/29/2021 01/28/2021  WBC 4.0 - 10.5 K/uL 16.1(H) 19.1(H) 17.6(H)  Hemoglobin 13.0 - 17.0 g/dL 10.8(L) 11.1(L) 10.7(L)   Hematocrit 39.0 - 52.0 % 32.1(L) 33.4(L) 31.6(L)  Platelets 150 - 400 K/uL 388 378 358   CMP Latest Ref Rng & Units 01/30/2021 01/27/2021 01/26/2021  Glucose 70 - 99 mg/dL 153(H) 139(H) 141(H)  BUN 6 - 20 mg/dL 14 14 15   Creatinine 0.61 - 1.24 mg/dL 0.70 0.68 0.75  Sodium 135 - 145 mmol/L 130(L) 134(L) 136  Potassium 3.5 - 5.1 mmol/L 4.6 4.4 4.4  Chloride 98 - 111 mmol/L 101 106 105  CO2 22 - 32 mmol/L 23 22 23   Calcium 8.9 - 10.3 mg/dL 8.8(L) 8.7(L) 8.6(L)  Total Protein 6.5 - 8.1 g/dL 6.7 6.5 -  Total Bilirubin 0.3 - 1.2 mg/dL 0.6 0.8 -  Alkaline Phos 38 - 126 U/L 136(H) 101 -  AST 15 - 41 U/L 41 40 -  ALT 0 - 44 U/L 92(H) 53(H) -     Imaging studies: No new pertinent imaging studies   Assessment/Plan:  56 y.o. male 7 Days Post-Op s/p exploratory laparotomy, colectomy, and colostomy creation (Hartmann's Procedure) for perforated diverticulitis   - Okay to advance to full liquid diet this morning  - Continue TPN; at goal; monitor electrolytes --. Begin to wean tomorrow (01/27)  - Continue IV Abx (Zosyn); reset clock given OR/perforation; Day 7  - Wound Care: Pack open portion of midline wound with dry gauze; cover and secure. We did preform this today (01/26)  - Continue surgical drain; monitor and record output  - WOC RN following  - Monitor abdominal examination  - Pain control prn (multimodal); antiemetics  - Mobilization as tolerated; PT following; no needs identified  All of the above findings and recommendations were discussed with the patient, and the medical team, and all of patient's and family's questions were answered to their expressed satisfaction.  -- Edison Simon, PA-C [facility] 01/30/2021, 7:44 AM [phone] M-F: 7am - 4pm

## 2021-01-31 ENCOUNTER — Inpatient Hospital Stay: Payer: Self-pay

## 2021-01-31 LAB — URINALYSIS, ROUTINE W REFLEX MICROSCOPIC
Bilirubin Urine: NEGATIVE
Glucose, UA: NEGATIVE mg/dL
Hgb urine dipstick: NEGATIVE
Ketones, ur: NEGATIVE mg/dL
Leukocytes,Ua: NEGATIVE
Nitrite: NEGATIVE
Protein, ur: NEGATIVE mg/dL
Specific Gravity, Urine: 1.015 (ref 1.005–1.030)
pH: 7.5 (ref 5.0–8.0)

## 2021-01-31 LAB — CBC
HCT: 31.2 % — ABNORMAL LOW (ref 39.0–52.0)
Hemoglobin: 10.5 g/dL — ABNORMAL LOW (ref 13.0–17.0)
MCH: 30.8 pg (ref 26.0–34.0)
MCHC: 33.7 g/dL (ref 30.0–36.0)
MCV: 91.5 fL (ref 80.0–100.0)
Platelets: 375 10*3/uL (ref 150–400)
RBC: 3.41 MIL/uL — ABNORMAL LOW (ref 4.22–5.81)
RDW: 14.4 % (ref 11.5–15.5)
WBC: 13.6 10*3/uL — ABNORMAL HIGH (ref 4.0–10.5)
nRBC: 0 % (ref 0.0–0.2)

## 2021-01-31 LAB — GLUCOSE, CAPILLARY
Glucose-Capillary: 137 mg/dL — ABNORMAL HIGH (ref 70–99)
Glucose-Capillary: 156 mg/dL — ABNORMAL HIGH (ref 70–99)

## 2021-01-31 MED ORDER — OXYCODONE HCL 5 MG PO TABS: 5.0000 mg | ORAL_TABLET | Freq: Four times a day (QID) | ORAL | 0 refills | Status: DC | PRN

## 2021-01-31 MED ORDER — ENSURE ENLIVE PO LIQD
237.0000 mL | Freq: Three times a day (TID) | ORAL | Status: DC
  Administered 2021-01-31 – 2021-02-01 (×3): 237 mL via ORAL

## 2021-01-31 MED ORDER — TRAVASOL 10 % IV SOLN
INTRAVENOUS | Status: AC
  Filled 2021-01-31: qty 564

## 2021-01-31 MED ORDER — CYCLOBENZAPRINE HCL 7.5 MG PO TABS: 7.5000 mg | ORAL_TABLET | Freq: Three times a day (TID) | ORAL | 0 refills | Status: DC

## 2021-01-31 MED ORDER — GABAPENTIN 100 MG PO CAPS: 200.0000 mg | ORAL_CAPSULE | Freq: Three times a day (TID) | ORAL | 0 refills | Status: AC

## 2021-01-31 NOTE — Progress Notes (Signed)
PHARMACY - TOTAL PARENTERAL NUTRITION CONSULT NOTE   Indication: Prolonged ileus  Patient Measurements: Height: 5\' 9"  (175.3 cm) Weight: 86.6 kg (190 lb 14.7 oz) IBW/kg (Calculated) : 70.7 TPN AdjBW (KG): 75.7 Body mass index is 28.19 kg/m.  Assessment: 56 y.o. male presenting with worsening abdominal pain. Pharmacy has been consulted for TPN.   Glucose / Insulin: 24 hr insulin requirement: 7 units Electrolytes: Hyponatremia Na: 134>130 Renal: Scr <1, stable Hepatic: ALT: 53>92, Triglycerides: 240>>201 Intake/Output last 2 shifts:  01/26 0701 - 01/27 0700 In: 8032 [P.O.:120; I.V.:3397.7; IV Piggyback:257.3] Out: 730 [Urine:700; Stool:30]  GI Imaging: 1/2 CT abdomen: Acute uncomplicated diverticulitis involving the upper sigmoid Colon 1/18 CT abdomen: Interval increase in now small to moderate amount of intraperitoneal free air. Continued findings of sigmoid diverticulitis, with stable pneumoperitoneum and left mid abdominal gas/fluid collections as seen on earlier CT 1/19: Small to moderate pneumoperitoneum. There is no evidence of intestinal obstruction GI Surgeries / Procedures: 1/19 exploratory laparotomy  Central access: 01/24/21 TPN start date: 01/24/21  Nutritional Goals: Goal TPN rate is 100 mL/hr (provides 112.8 g of protein and 2160.01 kcals per day)  RD Assessment: Estimated Needs Total Energy Estimated Needs: 2000-2300kcal/day Total Protein Estimated Needs: 100-115g/day Total Fluid Estimated Needs: 2.2-2.5L/day  Current Nutrition:  NPO  Plan:  wean TPN to 1/2 goal rate 50 mL/hr  Electrolytes in TPN: Na 100 mEq/L (from 50 mEq/L), K 30 mEq/L, Ca 5 mEq/L, Mg 5 mEq/L, and Phos 10 mmol/L. Cl:Ac 1:1 Add standard MVI and trace elements to TPN Stop SSI  Monitor TPN labs on Mon/Thurs at minimum  Dallie Piles, PharmD  01/31/2021 9:36 AM

## 2021-01-31 NOTE — Progress Notes (Signed)
Physical Therapy Treatment Patient Details Name: Hunter [name] MRN: [id number] DOB: [birth year] Today's Date: 01/31/2021   History of Present Illness Patient is a 56 year old male who presents to [facility] with Diverticulitis of large intestine with perforation. Patient recieved surgery on 01/23/21.    PT Comments    Pt seen for PT tx with pt received in bed with nursing just finishing giving pain medication. Pt is agreeable to tx & is able to complete bed mobility but relies on HOB being significantly elevated with use of bed rails & extra time. PT encouraged pt to ambulate without AD & pt ambulates 2 laps around nurses station with CGA<>min assist with decreased gait speed. Pt negotiates 3 steps with CGA<>supervision with use of 1 rail. Will continue to follow pt acutely to address high level balance & gait without AD to reduce fall risk prior to return home.     Recommendations for follow up therapy are one component of a multi-disciplinary discharge planning process, led by the attending physician.  Recommendations may be updated based on patient status, additional functional criteria and insurance authorization.  Follow Up Recommendations  No PT follow up     Assistance Recommended at Discharge PRN  Patient can return home with the following A little help with walking and/or transfers;Help with stairs or ramp for entrance;Assistance with cooking/housework   Equipment Recommendations  None recommended by PT    Recommendations for Other Services       Precautions / Restrictions Precautions Precautions: Fall Restrictions Weight Bearing Restrictions: No     Mobility  Bed Mobility Overal bed mobility: Modified Independent             General bed mobility comments: supine<>sit with HOB elevated, bed rails, extra time    Transfers Overall transfer level: Needs assistance Equipment used: None, Rolling walker (2 wheels) Transfers: Sit to/from Stand Sit to Stand:  Supervision           General transfer comment: Pt initially stands with RW with supervision but cuing to push to standing vs BUE on RW.    Ambulation/Gait Ambulation/Gait assistance: Min assist, Min guard Gait Distance (Feet): 325 Feet Assistive device: None Gait Pattern/deviations: Decreased step length - left, Decreased step length - right, Decreased stride length Gait velocity: decreased (pt slows down when looking around unit)     General Gait Details: 1 slight LOB with pt able to correct with min assist   Stairs Stairs: Yes Stairs assistance: Min guard, Supervision Stair Management: One rail Right Number of Stairs: 3 General stair comments: alternating pattern when ascending   Wheelchair Mobility    Modified Rankin (Stroke Patients Only)       Balance Overall balance assessment: Mild deficits observed, not formally tested Sitting-balance support: Feet supported Sitting balance-Leahy Scale: Good     Standing balance support: During functional activity, No upper extremity supported Standing balance-Leahy Scale: Fair                              Cognition Arousal/Alertness: Awake/alert Behavior During Therapy: WFL for tasks assessed/performed Overall Cognitive Status: Within Functional Limits for tasks assessed                                          Exercises      General Comments  Pertinent Vitals/Pain Pain Assessment Pain Assessment: Faces Pain Score: 7  Pain Location: Abdomen Pain Descriptors / Indicators: Aching, Discomfort Pain Intervention(s): Premedicated before session    Home Living                          Prior Function            PT Goals (current goals can now be found in the care plan section) Acute Rehab PT Goals Patient Stated Goal: to go home PT Goal Formulation: With patient Time For Goal Achievement: 02/10/21 Potential to Achieve Goals: Good Progress towards PT goals:  Progressing toward goals    Frequency    Min 2X/week      PT Plan Current plan remains appropriate    Co-evaluation              AM-PAC PT "6 Clicks" Mobility   Outcome Measure  Help needed turning from your back to your side while in a flat bed without using bedrails?: None Help needed moving from lying on your back to sitting on the side of a flat bed without using bedrails?: None Help needed moving to and from a bed to a chair (including a wheelchair)?: None Help needed standing up from a chair using your arms (e.g., wheelchair or bedside chair)?: None Help needed to walk in hospital room?: A Little Help needed climbing 3-5 steps with a railing? : A Little 6 Click Score: 22    End of Session Equipment Utilized During Treatment: Gait belt (positioned above drains) Activity Tolerance: Patient tolerated treatment well Patient left: in chair;with call bell/phone within reach Nurse Communication: Mobility status PT Visit Diagnosis: Pain;Unsteadiness on feet (R26.81);Difficulty in walking, not elsewhere classified (R26.2);Muscle weakness (generalized) (M62.81) Pain - part of body:  (abdomen)     Time: 2951-8841 PT Time Calculation (min) (ACUTE ONLY): 16 min  Charges:  $Therapeutic Activity: 8-22 mins                     Lavone Nian, PT, DPT 01/31/21, 12:21 PM   Waunita Schooner 01/31/2021, 12:20 PM

## 2021-01-31 NOTE — Progress Notes (Addendum)
[place] Hospital Day(s): [place] op day(s): 8 Days Post-Op.   Interval History:  Patient seen and examined Overnight, he did have a fever with T-max 100.61F at 0500 This morning, he reports he is doing well; making improvements No fever, chills, nausea, emesis His leukocytosis is improved this morning; 13.6K (from 16.1) Surgical drain output recorded at 0 ccs; serosanguinous Colostomy output not recorded either; liquid stool + gas He is on FLD + TPN at full rate He is OOB  Vital signs in last 24 hours: [min-max] current  Temp:  [98.3 F (36.8 C)-100.9 F (38.3 C)] 100.9 F (38.3 C) (01/27 0507) Pulse Rate:  [89-99] 92 (01/27 0507) Resp:  [18-20] 20 (01/27 0507) BP: (128-145)/(76-89) 145/89 (01/27 0507) SpO2:  [98 %-100 %] 100 % (01/27 0507) Weight:  [86.6 kg] 86.6 kg (01/27 0500)     Height: 5\' 9"  (175.3 cm) Weight: 86.6 kg BMI (Calculated): 28.18   Intake/Output last 2 shifts:  01/26 0701 - 01/27 0700 In: 3775 [P.O.:120; I.V.:3397.7; IV Piggyback:257.3] Out: 730 [Urine:700; Stool:30]   Physical Exam:  Constitutional: alert, cooperative and no distress  Respiratory: breathing non-labored at rest  Cardiovascular: regular rate and sinus rhythm  Gastrointestinal: soft, incisional soreness, and non-distended. Surgical drain in the RLQ with serosanguinous output. 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 and patent, gas and liquid stool present Integumentary: Midline is intact with staples, there is serosanguinous drainage mixed with likely liquefied fat tissue from removed staples in inferior portion, no purulence, penrose intact, the skin is mildly erythematous  Musculoskeletal: No peripheral edema, no calf pain  Labs:  CBC Latest Ref Rng & Units 01/31/2021 01/30/2021 01/29/2021  WBC 4.0 - 10.5 K/uL 13.6(H) 16.1(H) 19.1(H)  Hemoglobin 13.0 - 17.0 g/dL 10.5(L) 10.8(L) 11.1(L)  Hematocrit 39.0 - 52.0 % 31.2(L) 32.1(L) 33.4(L)   Platelets 150 - 400 K/uL 375 388 378   CMP Latest Ref Rng & Units 01/30/2021 01/27/2021 01/26/2021  Glucose 70 - 99 mg/dL 153(H) 139(H) 141(H)  BUN 6 - 20 mg/dL 14 14 15   Creatinine 0.61 - 1.24 mg/dL 0.70 0.68 0.75  Sodium 135 - 145 mmol/L 130(L) 134(L) 136  Potassium 3.5 - 5.1 mmol/L 4.6 4.4 4.4  Chloride 98 - 111 mmol/L 101 106 105  CO2 22 - 32 mmol/L 23 22 23   Calcium 8.9 - 10.3 mg/dL 8.8(L) 8.7(L) 8.6(L)  Total Protein 6.5 - 8.1 g/dL 6.7 6.5 -  Total Bilirubin 0.3 - 1.2 mg/dL 0.6 0.8 -  Alkaline Phos 38 - 126 U/L 136(H) 101 -  AST 15 - 41 U/L 41 40 -  ALT 0 - 44 U/L 92(H) 53(H) -     Imaging studies: No new pertinent imaging studies   Assessment/Plan:  56 y.o. male with intermittent low grade fevers 8 Days Post-Op s/p exploratory laparotomy, colectomy, and colostomy creation (Hartmann's Procedure) for perforated diverticulitis   - Okay to advance to soft diet  - Will begin to wean TPN today to 1/2 rate  - Continue IV Abx (Zosyn); reset clock given OR/perforation; Day 8 --> Would like to see leukocytosis normalize and be afebrile before stopping.   - Wound Care: Pack open portion of midline wound with dry gauze; cover and secure  - Continue surgical drain; monitor and record output --> May be able to remove this before discharge   - WOC RN following  - Monitor abdominal examination  - Pain control prn (multimodal); antiemetics  - Mobilization as tolerated;  PT following; no needs identified    - Discharge Planning; Pending advancement of diet, weaning from TPN, and ensure leukocytosis improves and he is afebrile x24 hours. May be able to discharge over the weekend.    All of the above findings and recommendations were discussed with the patient, and the medical team, and all of patient's and family's questions were answered to their expressed satisfaction.  -- Edison Simon, PA-C [facility] 01/31/2021, 7:23 AM [phone] M-F: 7am - 4pm

## 2021-01-31 NOTE — TOC Progression Note (Signed)
Transition of Care [facility]) - Progression Note    Patient Details  Name: Hunter [name] MRN: [id number] Date of Birth: [birth year]  Transition of [facility] CM/SW Contact  Beverly Sessions, RN Phone Number: 01/31/2021, 9:16 AM  Clinical Narrative:     Soft diet, plan to wean TPN to 1/2 rate today.  Pack open portion of midline wound with dry gauze; cover and secure.  Patient and family will need to be educated on wound care prior to discharge   Expected Discharge Plan: [place] Barriers to Discharge: Continued Medical Work up  Expected Discharge Plan and Services Expected Discharge Plan: [place]   Discharge Planning Services: CM Consult Post Acute Care Choice: [place] arrangements for the past 2 months: [place]: RN [facility] Agency: [place] Date [place]: 01/29/21   Representative spoke with at [place]: [place]   Social Determinants of Health ([place]) Interventions    Readmission Risk Interventions No flowsheet data found.

## 2021-01-31 NOTE — Plan of Care (Signed)
  Problem: Health Behavior/Discharge Planning: Goal: Ability to manage health-related needs will improve Outcome: Progressing   Problem: Clinical Measurements: Goal: Ability to maintain clinical measurements within normal limits will improve Outcome: Progressing   

## 2021-01-31 NOTE — Discharge Instructions (Signed)
In addition to included general post-operative instructions,  Diet: Resume home diet.   Activity: No heavy lifting >20 pounds (children, pets, laundry, garbage) or strenuous activity for 6 weeks, but light activity and walking are encouraged. Do not drive or drink alcohol if taking narcotic pain medications or having pain that might distract from driving.  Wound care: You may shower/get incision wet with soapy water and pat dry (do not rub incisions), but no baths or submerging incision underwater until follow-up.   Medications: Resume all home medications. For mild to moderate pain: acetaminophen (Tylenol) or ibuprofen/naproxen (if no kidney disease). Combining Tylenol with alcohol can substantially increase your risk of causing liver disease. Narcotic pain medications, if prescribed, can be used for severe pain, though may cause nausea, constipation, and drowsiness. Do not combine Tylenol and Percocet (or similar) within a 6 hour period as Percocet (and similar) contain(s) Tylenol. If you do not need the narcotic pain medication, you do not need to fill the prescription.  Call office ([phone] / [phone]) at any time if any questions, worsening pain, fevers/chills, bleeding, drainage from incision site, or other concerns.  

## 2021-02-01 ENCOUNTER — Other Ambulatory Visit: Payer: Self-pay | Admitting: Physician Assistant

## 2021-02-01 MED ORDER — AMOXICILLIN-POT CLAVULANATE 875-125 MG PO TABS: 1.0000 | ORAL_TABLET | Freq: Two times a day (BID) | ORAL | 0 refills | Status: AC

## 2021-02-01 NOTE — Discharge Summary (Signed)
Physician Discharge Summary  Patient ID: Hunter [name] MRN: [id number] DOB/AGE: 55-Oct-1967 55 y.o.  Admit date: 01/18/2021 Discharge date: 02/01/2021  Admission Diagnoses:  Discharge Diagnoses:  Principal Problem:   Diverticulitis of large intestine with perforation   Discharged Condition: good  Hospital Course: admitted 2.70  with complicated diverticulitis, medical management attempted and proceeded with definitive surgical removal of diseased colon.  Full course of abx given.  Pain control.    Significant Diagnostic Studies: radiology: CT scan: IMPRESSION:  Interval surgical resection of sigmoid colon. Colostomy is noted in the left mid abdomen. There is no evidence of intestinal obstruction. No abnormal loculated fluid collections are seen in the abdomen and pelvis. There is no hydronephrosis. There is contrast in the lumen of rectum.   Surgical drain is seen in left paracolic gutter extending to the right lower abdomen exiting through the right rectus muscle. There is surgical drain in the subcutaneous plane posterior to the surgical clips in the anterior abdominal wall. There is diffuse edema in the subcutaneous plane in the anterior abdominal wall, more so on the left side without any loculated fluid collections.   Small bilateral pleural effusions. Infiltrates in both lower lung fields may suggest atelectasis/pneumonia.     Electronically Signed   By: Elmer Picker M.D.   On: 01/28/2021 13:17  Treatments: surgery:  s/p exploratory laparotomy, colectomy, and colostomy creation (Hartmann's Procedure) for perforated diverticulitis January 24, 2021.   Discharge Exam: Blood pressure (!) 141/83, pulse 95, temperature 98.7 F (37.1 C), temperature source Oral, resp. rate 20, height 5\' 9"  (1.753 m), weight 85.7 kg, SpO2 99 %.   Constitutional: alert, cooperative and no distress, up and about tending to his colostomy appliance.  Respiratory: breathing  non-labored at rest  Cardiovascular: regular rate and sinus rhythm  Gastrointestinal: soft, incisional soreness, and non-distended. Surgical drain in the RLQ with serosanguinous output. 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 and patent, gas and liquid stool present Integumentary: Midline is intact with staples, there is serosanguinous drainage mixed with likely liquefied fat tissue from removed staples in inferior portion, no purulence, penrose intact, the skin is mildly erythematous  Disposition: Discharge disposition: 01-Home or Self Care       Discharge Instructions     Call MD for:  persistant nausea and vomiting   Complete by: As directed    Call MD for:  redness, tenderness, or signs of infection (pain, swelling, redness, odor or green/yellow discharge around incision site)   Complete by: As directed    Call MD for:  severe uncontrolled pain   Complete by: As directed    Diet - low sodium heart healthy   Complete by: As directed    Driving Restrictions   Complete by: As directed    No driving until cleared after follow-up appointment.  Is not advised to drive while taking narcotic pain medications or in significant pain.   Increase activity slowly   Complete by: As directed    Lifting restrictions   Complete by: As directed    Strongly advised against any form of lifting greater than 15 pounds over the next 4 to 6 weeks.  This involves pushing/pulling movements as well.  After 4 weeks when may gradually engage in more activities remaining aware of any new pain/tenderness elicited, and avoiding those for the full duration of 6 weeks.  Walking is encouraged.  Climbing stairs with caution.      Allergies as of 02/01/2021   No Known Allergies  Medication List     STOP taking these medications    oxyCODONE-acetaminophen 5-325 MG tablet Commonly known as: Percocet       TAKE these medications    aspirin EC 81 MG tablet Take 81 mg by mouth daily.    cyclobenzaprine 7.5 MG tablet Commonly known as: FEXMID Take 1 tablet (7.5 mg total) by mouth 3 (three) times daily.   gabapentin 100 MG capsule Commonly known as: NEURONTIN Take 2 capsules (200 mg total) by mouth 3 (three) times daily for 14 days.   oxyCODONE 5 MG immediate release tablet Commonly known as: Oxy IR/ROXICODONE Take 1 tablet (5 mg total) by mouth every 6 (six) hours as needed for severe pain or breakthrough pain.        Follow-up Information     Olean Ree, MD. Go on 02/07/2021.   Specialty: General Surgery Why: Go to appointment on 02/03 at 10:15 am Contact information: [address] [place] [address] [phone]                 Signed: Ronny Bacon, M.D., [facility] 02/01/2021, 3:02 PM

## 2021-02-01 NOTE — Progress Notes (Signed)
Patient's discharge orders verified with physicians. I reviewed patient's discharge instructions with him prior to PICC removal. Patient's PICC line was removed by IV team. Patient then left asap in private vehicle with wife.

## 2021-02-01 NOTE — TOC Progression Note (Addendum)
Transition of Care [facility]) - Progression Note    Patient Details  Name: Hunter [name] MRN: [id number] Date of Birth: 17-Oct-1965  [facility] CM/SW Contact  Izola Price, RN Phone Number: 02/01/2021, 4:16 PM  Clinical Narrative:  1/28: Patient to be discharged after TPN wean. No HH orders in chart. Provider notified and [facility] order for RN received and relayed to [facility] per [place] [facility]. Wound care supplies to be sent home for a few days. Wound care RN consult notes present on chart and prior CM notes indicate follow up for supplies/education needs. Spouse to pick up prescriptions at pharmacy prior to discharge before they close today. Simmie Davies RN CM      Expected Discharge Plan: [place] Barriers to Discharge: Barriers Resolved  Expected Discharge Plan and Services Expected Discharge Plan: [place]   Discharge Planning Services: CM Consult Post Acute Care Choice: [place] arrangements for the past 2 months: Single Family Home Expected Discharge Date: 02/01/21               DME Arranged: N/A DME Agency: NA       HH Arranged: RN [place] Agency: [place] Date HH Agency Contacted: 02/01/21 Time [place]: 3570 Representative spoke with at [place]: Confirmed with [place] today.   Social Determinants of Health (SDOH) Interventions    Readmission Risk Interventions No flowsheet data found.

## 2021-02-01 NOTE — Progress Notes (Signed)
PHARMACY - TOTAL PARENTERAL NUTRITION CONSULT NOTE   Indication: Prolonged ileus  Patient Measurements: Height: 5\' 9"  (175.3 cm) Weight: 85.7 kg (188 lb 15 oz) IBW/kg (Calculated) : 70.7 TPN AdjBW (KG): 75.7 Body mass index is 27.9 kg/m.  Assessment: 56 y.o. male presenting with worsening abdominal pain. Pharmacy has been consulted for TPN.   Glucose / Insulin: 24 hr insulin requirement: 4 units Electrolytes: Hyponatremia Na: 134>130 Renal: Scr <1, stable Hepatic: ALT: 53>92, Triglycerides: 240>>201 Intake/Output last 2 shifts:  01/25 0701 - 01/26 0700 In: 120 [P.O.:120] Out: 1953 [Urine:1450; Drains: 3; Stool: 500]  GI Imaging: 1/2 CT abdomen: Acute uncomplicated diverticulitis involving the upper sigmoid Colon 1/18 CT abdomen: Interval increase in now small to moderate amount of intraperitoneal free air. Continued findings of sigmoid diverticulitis, with stable pneumoperitoneum and left mid abdominal gas/fluid collections as seen on earlier CT 1/19: Small to moderate pneumoperitoneum. There is no evidence of intestinal obstruction GI Surgeries / Procedures: 1/19 exploratory laparotomy  Central access: 01/24/21 TPN start date: 01/24/21  Nutritional Goals: Goal TPN rate is 100 mL/hr (provides 112.8 g of protein and 2160.01 kcals per day)  RD Assessment: Estimated Needs Total Energy Estimated Needs: 2000-2300kcal/day Total Protein Estimated Needs: 100-115g/day Total Fluid Estimated Needs: 2.2-2.5L/day  Current Nutrition:  NPO  Plan:  No TPN for tonight per MD.  continue SSI Sensitive to q6h SSI and adjust as needed  Monitor TPN labs on Mon/Thurs at minimum  Oswald Hillock, PharmD  02/01/2021 10:27 AM

## 2021-02-04 ENCOUNTER — Telehealth: Payer: Self-pay | Admitting: *Deleted

## 2021-02-04 NOTE — Telephone Encounter (Signed)
FMLA completed for spouse and was mailed to home address

## 2021-02-07 ENCOUNTER — Encounter: Payer: Self-pay | Admitting: Surgery

## 2021-02-07 ENCOUNTER — Ambulatory Visit (INDEPENDENT_AMBULATORY_CARE_PROVIDER_SITE_OTHER): Payer: Self-pay | Admitting: Surgery

## 2021-02-07 ENCOUNTER — Other Ambulatory Visit: Payer: Self-pay

## 2021-02-07 VITALS — BP 123/88 | HR 112 | Temp 98.0°F | Ht 69.0 in | Wt 178.0 lb

## 2021-02-07 DIAGNOSIS — Z09 Encounter for follow-up examination after completed treatment for conditions other than malignant neoplasm: Secondary | ICD-10-CM

## 2021-02-07 DIAGNOSIS — K5720 Diverticulitis of large intestine with perforation and abscess without bleeding: Secondary | ICD-10-CM

## 2021-02-07 DIAGNOSIS — T8149XA Infection following a procedure, other surgical site, initial encounter: Secondary | ICD-10-CM

## 2021-02-07 MED ORDER — AMOXICILLIN-POT CLAVULANATE 875-125 MG PO TABS: 1.0000 | ORAL_TABLET | Freq: Two times a day (BID) | ORAL | 0 refills | Status: AC

## 2021-02-07 MED ORDER — OXYCODONE HCL 5 MG PO TABS: 5.0000 mg | ORAL_TABLET | Freq: Four times a day (QID) | ORAL | 0 refills | Status: AC | PRN

## 2021-02-07 NOTE — Patient Instructions (Addendum)
We want you to pack two areas on the abdomen with 1/2 inch iodoform packing. You may do this after the shower. Before the shower remove all dressings and packing. Let the warm water run over the area, rinse well, pat dry, then repack and redress the area.  When you pack be sure to pack down and up and then fill in the space on each area. Dress with a layer of gauze and then cover with abdominal pad.  Do this once a day. We have refilled your antibiotics. Take all medications prescribed. We will refill your Oxycodone. Take only as needed. You may also take Ibuprofen 600 mg(3 OTC tablets) every 6 hours.   Follow up here on Wednesday.   Try to eat a high protein diet, this helps with wound healing.  You may want to take your pain medications about 45 minutes prior to doing your dressing changes.

## 2021-02-07 NOTE — Progress Notes (Signed)
02/07/2021  HPI: Hunter [name] is a 56 y.o. male s/p exploratory laparotomy with Hartmann's procedure on 01/23/21 for worsening perforated diverticulitis with abscess.  He had post-op issues mostly related to pain control and also midline wound infection.  A few staples were removed while in hospital for drainage, with only serosanguinous/fat necrosis type of fluid.    Today, he reports that he's still having a lot of soreness at the midline incision and reports that just before coming to clinic he had more drainage from his wound.  Denies any fevers, chills, chest pain, shortness of breath.  He has been on his antibiotic and is almost done with the course.  Vital signs: BP 123/88    Pulse (!) 112    Temp 98 F (36.7 C)    Ht 5\' 9"  (1.753 m)    Wt 178 lb (80.7 kg)    SpO2 98%    BMI 26.29 kg/m    Physical Exam: Constitutional:  No acute distress Abdomen:  Soft, non-distended, with tenderness in the midline.  Inferior portion which had been open shows purulent drainage on the gauze.  No fascial dehiscence noted, but there is some residual fibrinous and purulent fluid which was cleaned at bedside.  No bile or stool noted in the wound.  The portion at the umbilicus which had been opened was almost sealed, so I opened that again and revealed some purulent fluid as well, though thinner looking.  The penrose drain was removed and the two wounds were packed with 1/2 inch iodoform gauze and covered with gauze and ABD pads.  His ostomy is healthy, with improving mucosal layer exteriorly, with gas and stool in the bag.    Assessment/Plan: This is a 56 y.o. male s/p Hartmann's for perforated diverticulitis with abscess  --Discussed with the patient that the midline incision shows infection with some purulent fluid.  It's been draining, but the cavity is not healing.  I removed the penrose drain and packed the wounds with 1/2 inch iodoform gauze and instructed him and his wife on how to do daily dressing  changes at home.  Will also extend the antibiotic course for him with new prescription. --Will also refill his oxycodone prescription to help with pain control. --Follow up next Wednesday for wound check.   Melvyn Neth, [facility]

## 2021-02-12 ENCOUNTER — Encounter: Payer: Self-pay | Admitting: Surgery

## 2021-02-12 ENCOUNTER — Other Ambulatory Visit: Payer: Self-pay

## 2021-02-12 ENCOUNTER — Ambulatory Visit (INDEPENDENT_AMBULATORY_CARE_PROVIDER_SITE_OTHER): Payer: Self-pay | Admitting: Surgery

## 2021-02-12 VITALS — BP 126/81 | HR 118 | Temp 97.9°F | Ht 69.0 in | Wt 183.2 lb

## 2021-02-12 DIAGNOSIS — T8149XA Infection following a procedure, other surgical site, initial encounter: Secondary | ICD-10-CM

## 2021-02-12 DIAGNOSIS — Z09 Encounter for follow-up examination after completed treatment for conditions other than malignant neoplasm: Secondary | ICD-10-CM

## 2021-02-12 DIAGNOSIS — K5720 Diverticulitis of large intestine with perforation and abscess without bleeding: Secondary | ICD-10-CM

## 2021-02-12 NOTE — Progress Notes (Signed)
02/12/2021  HPI: Hunter [name] is a 56 y.o. male s/p Hartmann's procedure for perforated diverticulitis.  He had a post-op wound infection and has been doing packing dressing changes with 1/2 inch iodoform.  He presents today for follow up.  Has been doing better with improving pain.  He's tolerating dressing changes better.  Reports the superior wound is getting smaller.  No issues with the ostomy and is working well.  Vital signs: BP 126/81    Pulse (!) 118    Temp 97.9 F (36.6 C) (Oral)    Ht 5\' 9"  (1.753 m)    Wt 183 lb 3.2 oz (83.1 kg)    SpO2 98%    BMI 27.05 kg/m    Physical Exam: Constitutional:  No acute distress Abdomen:  soft, non-distended, appropriately sore to palpation.  Incision has two open areas which have healthy surrounding granulation tissue.  The superior wound is small and with only about 8 mm depth, about 1 cm size.  The inferior wound still tracks superiorly and inferiorly about 2 cm each direction, depth reaching the fascia, which is intact.  Packed with 1/2 inch iodoform gauze again.  Dressed with dry gauze and tape.  Staples were removed without complications.  LLQ ostomy with healthy pink mucosa.  Assessment/Plan: This is a 56 y.o. male s/p Hartmann's procedure with post-op wound infection.  --Patient is healing well, without any worsening of the wounds and his pain is improving.  Continue packing the wounds with 1/2 inch iodoform as instructed.  No further antibiotic refills are needed. --staples removed at bedside. --Follow up in 3 weeks for wound check.   Melvyn Neth, [facility]

## 2021-02-12 NOTE — Patient Instructions (Addendum)
Once your skin has healed, you may do your normal activities.   If you have any concerns or questions, please feel free to call our office. See follow up appointment below.   Wound Packing  Wound packing usually involves placing a moistened packing material into your wound and then covering it with an outer bandage (dressing). This helps support the healing of deep tissue and tissue under the skin. It also helps prevent bleeding, infection, and further injury. Wounds are packed until deep tissues heal. The time it takes for this to happen is different for everyone. Your health care provider will show you how to pack and dress your wound. Using gloves and a clean technique is important to avoid spreading germs into your wound. Supplies needed: Soap and water. Disposable gloves. Cleansing or wetting solution, such as saline, germ-free (sterile) water, or an antiseptic solution. Clean bowl. Clean packing material, such as gauze, gauze sponges, or rolled gauze. Clean paper towels. Outer dressing. This includes the cover dressing and tape, or a dressing with an adhesive border. Cotton-tipped swabs. Small plastic bag for trash. How to pack your wound Follow your health care provider's instructions on how often you need to change dressings and pack your wound. You will likely be asked to change your dressings 1 to 2 times a day. Preparing to change the wound packing If needed, take pain medicine 30 minutes before you pack your wound as told by your health care provider. Preparing the new packing material  Clean and disinfect your work surface or countertop. Set a plastic bag on or near your work surface. Wash your hands with soap and water for at least 20 seconds before you change the dressing. If soap and water are not available, use hand sanitizer. Put a clean paper towel on the counter. Put a clean bowl on the towel. Only touch the outside of the bowl when handling it. Pour the cleansing or  wetting solution that your health care provider tells you to use into the bowl. Select and cut your packing material to fit the size of your wound. Avoid using multiple pieces of packing material. Drop it into the bowl. Cut tape strips that you will use to seal the outer dressing, if needed. Put gauze pads for cleansing and cotton-tipped swabs on the clean paper towel. Removing the old packing material and dressing Put on a set of gloves. Gently remove the old dressing and packing material. Make sure to check how the drainage looks or if there is any odor. Clean or rinse (irrigate) the wound. Remove your gloves. Put the removed items, including gloves, into the plastic bag to throw away later. Wash your hands again with soap and water for at least 20 seconds. If soap and water are not available, use hand sanitizer. Applying the new packing material and dressing  Put on a new set of gloves. Squeeze the packing material in the bowl to release the extra liquid. The packing material should be moist, but not dripping wet. Gently place the packing material into the wound. Use a cotton-tipped swab to guide it into place, filling all of the space. Do not overpack the wound bed. Dry your gloved fingertips on the paper towel. Open up your outer dressing supplies and put them on a dry part of the paper towel. Keep them from getting wet. Place the outer dressing over the packed wound. Tape the edges of the outer dressing in place. Remove your gloves. Wash your hands again with soap  and water for at least 20 seconds. If soap and water are not available, use hand sanitizer. Put the removed items, including gloves, into the plastic bag to throw away. Clean and disinfect your work surface or countertop. General tips Follow your health care provider's instructions on how much to pack the wound. At first, you may need to pack it more fully to help stop bleeding. As the wound begins to heal inside, you will  use less packing material and pack the wound loosely to allow the tissue to heal slowly from the inside out. Do not take baths, swim, or use a hot tub until your health care provider approves. Ask your health care provider if you may take showers. You may only be allowed to take sponge baths. Keep the dressing clean and dry. Follow any other instructions given by your health care provider on how to aid healing. This may include applying warm or cold compresses, raising (elevating) the affected area, or wearing a compression dressing. Check your wound site every day for signs of infection. Check for: More redness, swelling, or pain. More fluid or blood. Warmth or hardness (induration). Pus or a bad smell. Protect your wound from the sun when you are outside for the first 6 months, or for as long as told by your health care provider. Cover up the scar area or apply sunscreen that has an SPF of at least 41. Keep all follow-up visits. This is important. Contact a health care provider if: Your pain is not controlled with pain medicine. You have more drainage, redness, swelling, or pain at your wound site. You have new rash, warmth, or induration around the wound. You have a fever or chills. Your wound becomes larger or deeper. Get help right away if: The tissue inside your wound changes color from pink to white, yellow, or black. You notice a bad smell or pus coming from the wound site. You are having trouble packing your wound. Your wound is bleeding, and the bleeding does not stop with gentle pressure. These symptoms may represent a serious problem that is an emergency. Do not wait to see if the symptoms will go away. Get medical help right away. Call your local emergency services (911 in the [place] Do not drive yourself to the hospital. Summary Wound packing usually involves placing a moistened packing material into your wound and then covering it with an outer bandage (dressing). Follow your  health care provider's instructions on how often you need to change dressings and pack your wound. You will likely be asked to change dressings 1 to 2 times a day. When packing your wound, it is important to use gloves to avoid spreading germs into the wound. Check your wound site every day for signs of infection. This information is not intended to replace advice given to you by your health care provider. Make sure you discuss any questions you have with your health care provider. Document Revised: 04/30/2020 Document Reviewed: 04/30/2020 Elsevier Patient Education  2022 [place], Adult Colostomy surgery is done to create an opening in the front of the abdomen for stool (feces) to leave the body through an ostomy (stoma). Part of the large intestine is attached to the stoma. A bag, also called a pouch, is fitted over the stoma. Stool and gas will collect in the bag. After surgery, you will need to empty and change your colostomy bag as needed. You will also need to care for your stoma. How  to care for the stoma Your stoma should look pink, red, and moist, like the inside of your cheek. Soon after surgery, the stoma may be swollen, but this swelling will go away within 6 weeks. To care for the stoma: Keep the skin around the stoma clean and dry. Use a clean, soft washcloth to gently wash the stoma and the skin around it. Clean using a circular motion, and wipe away from the stoma opening, not toward it. Use warm water and only use cleansers recommended by your health care provider. Rinse the stoma area with plain water. Dry the area around the stoma well. Use stoma powder or ointment on your skin only as told by your health care provider. Do not use any other powders, gels, wipes, or creams on the skin around the stoma. Check the stoma area every day for signs of infection. Check for: New or worsening redness, swelling, or pain. New or increased fluid or blood. Pus  or warmth. Measure the stoma opening regularly and record the size. Watch for changes. (It is normal for the stoma to get smaller as swelling goes away.) Share this information with your health care provider. How to empty the colostomy bag Empty your bag at bedtime and whenever it is one-third to one-half full. Do not let the bag get more than half-full with stool or gas. The bag could leak if it gets too full. Some colostomy bags have a built-in gas release valve that releases gas often throughout the day. Follow these basic steps: Wash your hands with soap and water. Sit far back on the toilet seat. Put several pieces of toilet paper into the toilet water. This will prevent splashing as you empty stool into the toilet. Remove the clip or the hook-and-loop fastener from the tail end of the bag. Unroll the tail, then empty the stool into the toilet. Clean the tail with toilet paper or a moist towelette. Reroll the tail, and close it with the clip or the hook-and-loop fastener. Wash your hands again. How to change the colostomy bag Change your bag every 3-4 days or as often as told by your health care provider. Also change the bag if it is leaking or separating from the skin, or if your skin around the stoma looks or feels irritated. Irritated skin may be a sign that the bag is leaking. Always have colostomy supplies with you, and follow these basic steps: Wash your hands with soap and water. Have paper towels or tissues nearby to clean any discharge. Remove the old bag and skin barrier. Use your fingers or a warm cloth to gently push the skin away from the barrier. Clean the stoma area with water or with mild soap and water, as directed. Use water to rinse away any soap. Dry the skin. You may use the cool setting on a hair dryer to do this. Use a tracing pattern (template) to cut the skin barrier to the size needed. If you are using a two-piece bag, attach the bag and the skin barrier to each  other. Add the barrier ring, if you use one. If directed, apply stoma powder or skin barrier gel to the skin. Warm the skin barrier with your hands, or blow with a hair dryer for 5-10 seconds. Remove the paper from the adhesive strip of the skin barrier. Press the adhesive strip onto the skin around the stoma. Gently rub the skin barrier onto the skin. This creates heat that helps the barrier to stick. Apply  stoma tape to the edges of the skin barrier, if desired. Wash your hands again. General recommendations Avoid wearing tight clothes or having anything press directly on your stoma or bag. Change your clothing whenever it is soiled or damp. You may shower or bathe with the bag on or off. Do not use harsh or oily soaps or lotions. Dry the skin and bag after bathing. Store all supplies in a cool, dry place. Do not leave supplies in extreme heat because some parts can melt or not stick as well. Whenever you leave home, take extra clothing and an extra skin barrier and bag with you. If your bag gets wet, you can dry it with a hair dryer on the cool setting. To prevent odor, you may put drops of ostomy deodorizer in the bag. If recommended by your health care provider, put ostomy lubricant inside the bag. This helps stool to slide out of the bag more easily and completely. Contact a health care provider if: You have new or worsening redness, swelling, or pain around your stoma. You have new or increased fluid or blood coming from your stoma. Your stoma feels warm to the touch. You have pus coming from your stoma. Your stoma extends in or out farther than normal. You need to change your bag every day. You have a fever. Get help right away if: Your stool is bloody. You have nausea or you vomit. You have trouble breathing. Summary Measure your stoma opening regularly and record the size. Watch for changes. Empty your bag at bedtime and whenever it is one-third to one-half full. Do not let  the bag get more than half-full with stool or gas. Change your bag every 3-4 days or as often as told by your health care provider. Whenever you leave home, take extra clothing and an extra skin barrier and bag with you. This information is not intended to replace advice given to you by your health care provider. Make sure you discuss any questions you have with your health care provider. Document Revised: 08/24/2019 Document Reviewed: 08/24/2019 Elsevier Patient Education  2022 [employer].

## 2021-03-05 ENCOUNTER — Ambulatory Visit (INDEPENDENT_AMBULATORY_CARE_PROVIDER_SITE_OTHER): Payer: Self-pay | Admitting: Surgery

## 2021-03-05 ENCOUNTER — Encounter: Payer: Self-pay | Admitting: Surgery

## 2021-03-05 ENCOUNTER — Other Ambulatory Visit: Payer: Self-pay

## 2021-03-05 VITALS — BP 143/89 | HR 87 | Temp 98.5°F | Ht 69.0 in | Wt 181.2 lb

## 2021-03-05 DIAGNOSIS — Z933 Colostomy status: Secondary | ICD-10-CM

## 2021-03-05 DIAGNOSIS — Z09 Encounter for follow-up examination after completed treatment for conditions other than malignant neoplasm: Secondary | ICD-10-CM

## 2021-03-05 DIAGNOSIS — K5720 Diverticulitis of large intestine with perforation and abscess without bleeding: Secondary | ICD-10-CM

## 2021-03-05 NOTE — Patient Instructions (Addendum)
A referral has been placed with [facility] Surgery. They will call you with an appointment.  ? ? ?If you have any concerns or questions, please feel free to call our office. Follow up as needed.   ? ?Exploratory Laparotomy, Adult, Care After ?The following information offers guidance on how to care for yourself after your procedure. Your health care provider may also give you more specific instructions. If you have problems or questions, contact your health care provider. ?What can I expect after the procedure? ?After the procedure, it is common to have: ?Abdominal soreness. ?Fatigue. ?Bloating. ?Gas. ?A sore throat from having had a breathing or draining tube in your throat. ?A lack of appetite. ?Follow these instructions at home: ?Medicines ?Take over-the-counter and prescription medicines only as told by your health care provider. ?If you were prescribed an antibiotic medicine, take it as told by your health care provider. Do not stop taking the antibiotic even if you start to feel better. ?Ask your health care provider if the medicine prescribed to you: ?Requires you to avoid driving or using machinery. ?Can cause constipation. You may need to take these actions to prevent or treat constipation: ?Drink enough fluid to keep your urine pale yellow. ?Take over-the-counter or prescription medicines. Undergoing surgery and taking pain medicines can make constipation worse. ?Eat foods that are high in fiber, such as beans, whole grains, and fresh fruits and vegetables. ?Limit foods that are high in fat and processed sugars, such as fried or sweet foods. ?Incision care ? ?Follow instructions from your health care provider about how to take care of your incision. Make sure you: ?Wash your hands with soap and water for at least 20 seconds before and after you change your bandage (dressing). If soap and water are not available, use hand sanitizer. ?Change your dressing as told by your health care provider. ?Leave  stitches (sutures), skin glue, or adhesive strips in place. These skin closures may need to stay in place for 2 weeks or longer. If adhesive strip edges start to loosen and curl up, you may trim the loose edges. Do not remove adhesive strips completely unless your health care provider tells you to do that. ?If you were sent home with a drain, follow instructions from your health care provider about how to care for it. ?Check your incision area every day for signs of infection. Check for: ?Redness, swelling, or pain. ?Fluid or blood. ?Warmth. ?Pus or a bad smell. ?Activity ? ?Rest as told by your health care provider. ?Avoid sitting for a long time without moving. Get up to take short walks every 1-2 hours. This is important to improve blood flow and breathing. Ask for help if you feel weak or unsteady. ?Do not lift anything that is heavier than 5 lb (2.3 kg), or the limit that you are told, until your health care provider says that it is safe. ?Return to your normal activities as told by your health care provider. Ask your health care provider what activities are safe for you. ?Bathing ?Keep your incision clean and dry. Clean it as often as told by your health care provider. You may be told to: ?Gently wash the incision with soap and water. ?Rinse the incision with water to remove all soap. ?Pat the incision dry with a clean towel. Do not rub the incision. ?General instructions ?Do not use any products that contain nicotine or tobacco. These products include cigarettes, chewing tobacco, and vaping devices, such as e-cigarettes. These can delay incision  healing after surgery. If you need help quitting, ask your health care provider. ?Wear compression stockings as told by your health care provider. These stockings help to prevent blood clots and reduce swelling in your legs. ?Keep all follow-up visits. This is important. ?Contact a health care provider if: ?You have a fever or chills. ?Your pain medicine is not  helping. ?You have constipation or diarrhea. ?You have nausea or vomiting. ?You have drainage, redness, swelling, or pain at your incision site. ?Get help right away if: ?Your pain is getting worse. ?You have not had a bowel movement for more than 3 days. ?You have ongoing (persistent) vomiting. ?The edges of your incision open up. ?You have warmth, tenderness, or swelling in your calf. ?You have trouble breathing. ?You have chest pain. ?These symptoms may represent a serious problem that is an emergency. Do not wait to see if the symptoms will go away. Get medical help right away. Call your local emergency services (911 in the [place] Do not drive yourself to the hospital. ?Summary ?Abdominal soreness is common after exploratory laparotomy. Take over-the-counter and prescription medicines only as told by your health care provider. ?Follow instructions from your health care provider about how to take care of your incision. ?Do not lift anything that is heavier than 5 lb (2.3 kg), or the limit that you are told, until your health care provider says that it is safe. ?This information is not intended to replace advice given to you by your health care provider. Make sure you discuss any questions you have with your health care provider. ?Document Revised: 09/05/2019 Document Reviewed: 09/05/2019 ?Elsevier Patient Education ? [place]. ? ?

## 2021-03-05 NOTE — Progress Notes (Signed)
?03/05/2021 ? ?History of Present Illness: ?Hunter [name] is a 56 y.o. male s/p Hartmann's procedure for perforated diverticulitis on 01/23/21.  He was initially admitted on 01/18/21 for this and conservative management was attempted.  He also had financial assistance through [facility] but when trying to transfer him unfortunately the hospital had no beds.  Thus he underwent Hartmann's at [facility] after prolonged symptoms and no improvement.  He had a prolonged ileus requiring TPN and also significant post-op pain issues which eventually improved.  There was also concern for post-op wound infection and a few staples were removed while in hospital.  As outpatient, his wound continued to drain and penrose drain and staples were removed, with wound packing in two portion of his wound.  He presents today for follow up. ? ?Reports that he's doing very well.  He is in very good spirits and very thankful for his care in the hospital.  He reports that his wound is almost healed and there's no real room for packing anymore.  No issues with abdominal pain, nausea, or vomiting.  His ostomy is working well without any troubles. ? ?Past Medical History: ?--Perforated diverticulitis  ? ?Past Surgical History: ?Past Surgical History:  ?Procedure Laterality Date  ? COLECTOMY WITH COLOSTOMY CREATION/HARTMANN PROCEDURE N/A 01/23/2021  ? Procedure: COLECTOMY WITH COLOSTOMY CREATION/HARTMANN PROCEDURE;  Surgeon: Olean Ree, MD;  Location: [facility] ORS;  Service: General;  Laterality: N/A;  ? COLONOSCOPY WITH PROPOFOL N/A 03/26/2020  ? Procedure: COLONOSCOPY WITH PROPOFOL;  Surgeon: Jonathon Bellows, MD;  Location: [facility] ENDOSCOPY;  Service: Gastroenterology;  Laterality: N/A;  ? I & D EXTREMITY Left 06/02/2018  ? Procedure: IRRIGATION AND DEBRIDEMENT LEFT FOREARM AND WOUND VAC PLACEMENT;  Surgeon: Hessie Knows, MD;  Location: [facility] ORS;  Service: Orthopedics;  Laterality: Left;  ? LAPAROTOMY N/A 01/23/2021  ? Procedure: EXPLORATORY LAPAROTOMY;   Surgeon: Olean Ree, MD;  Location: [facility] ORS;  Service: General;  Laterality: N/A;  ? ? ?Home Medications: ?Prior to Admission medications   ?Medication Sig Start Date End Date Taking? Authorizing Provider  ?oxyCODONE (OXY IR/ROXICODONE) 5 MG immediate release tablet Take 1 tablet (5 mg total) by mouth every 6 (six) hours as needed for severe pain or breakthrough pain. 02/07/21  Yes Pepe Mineau, Jacqulyn Bath, MD  ?gabapentin (NEURONTIN) 100 MG capsule Take 2 capsules (200 mg total) by mouth 3 (three) times daily for 14 days. 01/31/21 02/14/21  Tylene Fantasia, PA-C  ? ? ?Allergies: ?No Known Allergies ? ?Review of Systems: ?Review of Systems  ?Constitutional:  Negative for chills and fever.  ?HENT:  Negative for hearing loss.   ?Respiratory:  Negative for shortness of breath.   ?Cardiovascular:  Negative for chest pain.  ?Gastrointestinal:  Negative for abdominal pain, nausea and vomiting.  ?Musculoskeletal:  Negative for myalgias.  ?Skin:  Negative for rash.  ? ?Physical Exam ?BP (!) 143/89   Pulse 87   Temp 98.5 ?F (36.9 ?C) (Oral)   Ht 5\' 9"  (1.753 m)   Wt 181 lb 3.2 oz (82.2 kg)   SpO2 97%   BMI 26.76 kg/m?  ?CONSTITUTIONAL: No acute distress, well nourished. ?HEENT:  Normocephalic, atraumatic, extraocular motion intact. ?RESPIRATORY:  Normal respiratory effort without pathologic use of accessory muscles. ?CARDIOVASCULAR: Regular rhythm and rate. ?GI: The abdomen is soft, non-distended, non-tender to palpation.  Midline wound is almost healed.  There is a small, 3 mm opening inferiorly, too small for qtip to probe.  Used the back of the qtip to probe the wound  and it is about 1.5 cm deep and about 1.5 cm diameter, with a very small entry site of 3 mm.  Dry gauze dressing applied.  LLQ ostomy pink, viable, with stool in bag. ?NEUROLOGIC:  Motor and sensation is grossly normal.  Cranial nerves are grossly intact. ?PSYCH:  Alert and oriented to person, place and time. Affect is normal. ? ?Assessment and Plan: ?This is  a 56 y.o. male with perforated diverticulitis, s/p Hartmann's. ? ?--Discussed with the patient that the wound is so small now that I agree cannot be packed as the opening is too small.  Continue with dry gauze dressing only. ?--He's otherwise healing well and is now 6 weeks post-op.  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  After it's healed, he can return to work.  Now that he's 6 weeks out, he has no further activity restrictions. ?--Since the patient has [employer] care financial assistance, discussed with him the we can do a referral to [facility] general surgery to see him and evaluate him for eventual colostomy reversal.  Ideally for now would want to wait for any infection, inflammation, scarring to ease up before going back and discussed with him the potential for minimally invasive colostomy reversal approach.  If at the end the patient wishes to continue his care at [facility], [place] be happy to see him back, but at least this way he can have more financial assistance with coverage of his surgical care.   ?--Follow up as needed. ? ? ?Melvyn Neth, MD ?[facility] ? ? ?  ?

## 2021-12-01 NOTE — Progress Notes (Signed)
 [facility] Note   Patient Name: Hunter [name]  MRN #: [id number]  Date of Service: December 01, 2021   Admitting Physician: Zoila Aloysius Harm, MD   Chief Complaint: S/p end colostomy take down with primary colorectal anastomosis   Reason for Visit  Post op visit  Attending Surgeon A/P   Doing well from the colorectal perspective as noted. Incisions have all healed well. He is however complaining of difficulties with ejaculation (dry ejaculate). Denies any difficulties with erection or urination. In addition he is complaining of right shoulder limited ROM.  Plan: Urology and orthopedic referrals. Knows importance of surveillance colonoscopy. Dietary and fiber recommendations reviewed.  Assuming symptoms resolve. He will RTC on a PRN basis.  I saw and evaluated the patient, participating in the key portions of the service.  I reviewed the resident's note.  I agree with the resident's findings and plan. Aloysius KANDICE Zoila, MD  Assessment  Hunter [name] is a 56 y.o. male perforated diverticulitis s/p ex lap, partial colectomy with colostomy creation at [facility] on 01/23/21 who presented on 08/07/21 for end colostomy takedown with primary colorectal stapled anastomosis with abdominal wall reconstruction with Dr. Robynn who presents for routine follow up. Patient is recovering well from a colorectal standpoint, gaining weight appropriately, tolerating PO and having regular bowel movements. Does have a small area in the lateral aspect of his prior ostomy site with delayed healing likely 2/2 sutures, however no signs of infection at this point and is continuing to heal appropriately. Does report new symptoms of delayed ejaculation and right shoulder stiffness since surgery.   Plan  Recommend discontinuing vaseline and applying BALMEX or Desitin cream to his peri-anal skin for skin irritation Urology and Orthopaedic Surgery referrals  placed Colonoscopy in 1 year Patient may RTC prn or if his prior ostomy wound continues to have delayed healing for wound evaluation  Subjective  HPI: Hunter [name] is a 56 y.o. male PMHX perforated diverticulitis s/p ex lap, partial colectomy with colostomy creation at [facility] on 01/23/21 who presented on 08/07/21 for end colostomy takedown with primary colorectal stapled anastomosis with abdominal wall reconstruction with Dr. Robynn.   Patient reports he still has an area at the lateral aspect of his prior ostomy site that has not fully healed and reports it scabs over every time he showers. Denies any drainage from this area. Is eating well, gained about 10 lbs in the last 3 months. Continues to have bowel function, does report he has been taking fiber and this is helping bulk up his stools, although says they are soft and broken versus continuously solid. Has 2 bowel movements a day usually. Does have a speck of blood sometimes when he wipes, has been applying vaseline to the area for irritation. Reports right shoulder pain, stiffness and decreased mobility; unable to lift his arm above his shoulder, and this has been ongoing since surgery. Does improve somewhat after hot showers. Also reports issues with ejaculation since surgery; states he has the sensation to ejaculate but is unable to, did not have this issue prior to surgery.  Review of Systems: A 12 system review of systems was negative except as noted in the HPI.  No Known Allergies No past medical history on file. Past Surgical History:  Procedure Laterality Date  . COLON SURGERY    . PR CLOSE ENTEROSTOMY N/A 08/07/2021   Procedure: CLO ENTEROSTOMY LG/SM INTEST;  Surgeon: Aloysius Harm Zoila, MD;  Location: MAIN OR [facility];  Service: Gastrointestinal  . PR COLONOSCOPY FLX DX W/COLLJ SPEC WHEN PFRMD N/A 07/23/2021   Procedure: COLONOSCOPY, FLEXIBLE, PROXIMAL TO SPLENIC FLEXURE; DIAGNOSTIC, W/WO COLLECTION SPECIMEN BY BRUSH OR WASH;   Surgeon: Jacques Alm Buss, MD;  Location: GI PROCEDURES [place] [facility];  Service: Gastroenterology  . PR CYSTOSCOPY,INSERT URETERAL STENT Bilateral 08/07/2021   Procedure: CYSTOURETHROSCOPY,  WITH INSERTION OF INDWELLING URETERAL STENT (EG, GIBBONS OR DOUBLE-J TYPE);  Surgeon: Catherene Cheadle, MD;  Location: MAIN OR [facility];  Service: Urology  . PR DEBRIDEMENT, SKIN, SUB-Q TISSUE,=<20 SQ CM Midline 08/07/2021   Procedure: DEBRIDEMENT; SKIN & SUBCUTANEOUS TISSUE ABDOMEN;  Surgeon: Aloysius Euell Quale, MD;  Location: MAIN OR [facility];  Service: Gastrointestinal  . PR EXPLORATORY OF ABDOMEN N/A 08/07/2021   Procedure: EXPLORATORY LAPAROTOMY, EXPLORATORY CELIOTOMY WITH OR WITHOUT BIOPSY(S);  Surgeon: Aloysius Euell Quale, MD;  Location: MAIN OR [facility];  Service: Gastrointestinal  . PR FREEING BOWEL ADHESION,ENTEROLYSIS Midline 08/07/2021   Procedure: ENTEROLYSIS (SEPART PROC);  Surgeon: Aloysius Euell Quale, MD;  Location: MAIN OR [facility];  Service: Gastrointestinal  . PR PART REMOVAL COLON W COLOPROCTOSTOMY N/A 08/07/2021   Procedure: COLECTOMY, PARTIAL; WITH COLOPROCTOSTOMY (LOW PELVIC ANASTOMOSIS);  Surgeon: Aloysius Euell Quale, MD;  Location: MAIN OR [facility];  Service: Gastrointestinal  . PR RPR AA HERNIA 1ST 3-10 CM REDUCIBLE N/A 08/07/2021   Procedure: VENTRAL REPAIR OF ANTERIOR ABDOMINAL HERNIA(S) ANY APPROACH, INITIAL, INCLUDING IMPLANTATION OF MESH OR OTHER PROSTHESIS WHEN PERFORMED, TOTAL LENGTHOF DEFECT(S); 3 CM TO 10 CM, REDUCIBLE;  Surgeon: Aloysius Euell Quale, MD;  Location: MAIN OR [facility];  Service: Gastrointestinal  . PR RPR PARASTOMAL HERNIA 1ST/RECR REDUCIBLE Left 08/07/2021   Procedure: REPAIR OF PARASTOMAL HERNIA, ANY APPROACH (IE, OPEN, LAPAROSCOPIC, ROBOTIC), INITIAL OR RECURRENT, INCLUDING IMPLANTATION OF MESH OR OTHER PROSTHESIS, WHEN PERFORMED; REDUCIBLE;  Surgeon: Aloysius Euell Quale, MD;  Location: MAIN OR [facility];  Service: Gastrointestinal   Current Outpatient Medications  Medication Sig Dispense Refill  .  acetaminophen  (TYLENOL ) 500 MG tablet Take 1-2 tablets (500 mg - 1 g total) by mouth every 6 hours as needed for pain. Prescription not provided, medication is available over the counter. Take as directed on label (Patient not taking: Reported on 12/01/2021) 30 tablet 0  . sildenafiL (VIAGRA) 100 MG tablet  (Patient not taking: Reported on 12/01/2021)     No current facility-administered medications for this visit.   Family History  Problem Relation Age of Onset  . Cancer Father        leukemia  . Colorectal Cancer Other   . Breast cancer Neg Hx    Social History   Socioeconomic History  . Marital status: Married    Spouse name: Wilhelm Ganaway  . Number of children: None  . Years of education: None  . Highest education level: High school graduate  Occupational History  . Occupation: Holiday representative  Tobacco Use  . Smoking status: Every Day    Packs/day: 1.00    Years: 35.00    Additional pack years: 0.00    Total pack years: 35.00    Types: Cigarettes    Start date: 08/28/1980  . Smokeless tobacco: Never  Vaping Use  . Vaping Use: Never used  Substance and Sexual Activity  . Alcohol use: Yes    Alcohol/week: 3.0 standard drinks of alcohol    Types: 3 Cans of beer per week  . Drug use: Never   Social Determinants of Health   Financial Resource Strain: Low Risk  (08/08/2021)   Overall  Financial Resource Strain (CARDIA)   . Difficulty of Paying Living Expenses: Not hard at all  Food Insecurity: No Food Insecurity (08/08/2021)   Hunger Vital Sign   . Worried About Programme researcher, broadcasting/film/video in the Last Year: Never true   . Ran Out of Food in the Last Year: Never true  Transportation Needs: No Transportation Needs (08/08/2021)   PRAPARE - Transportation   . Lack of Transportation (Medical): No   . Lack of Transportation (Non-Medical): No  Physical Activity: Sufficiently Active (08/08/2021)   Exercise Vital Sign   . Days of Exercise per Week: 7 days   . Minutes of Exercise per Session: 30  min  Stress: No Stress Concern Present (08/08/2021)   [employer] of Occupational Health - Occupational Stress Questionnaire   . Feeling of Stress : Not at all  Social Connections: Moderately Isolated (08/08/2021)   Social Connection and Isolation Panel [NHANES]   . Frequency of Communication with Friends and Family: More than three times a week   . Frequency of Social Gatherings with Friends and Family: Three times a week   . Attends Religious Services: Never   . Active Member of Clubs or Organizations: No   . Attends Banker Meetings: Never   . Marital Status: Married   Objective   Vitals:   12/01/21 0916  BP: 140/97  Pulse: 78  Temp: 36.4 C (97.5 F)    Physical Exam: General: alert, well appearing, in no acute distress Cardiovascular: regular rate, normotensive Pulmonary: breathing comfortably on room air Abdominal: soft, nondistended, and nontender to palpation. Midline wound well healed. Old ostomy wound with an area in the lateral aspect with granulation tissue, no signs of infection, no drainage. Peri-anal skin moist, no signs of fissures or open wounds. Extremities: warm and well perfused bilaterally, no edema Skin: skin color, texture, and turgor are normal. No visible rashes or suspicious lesions Musculoskeletal: no joint tenderness, deformity, swelling, or muscular tenderness noted. Full range of motion without pain Neurologic: AAOx3. No visible tremor. Grossly intact neurologically Psychiatric: alert and oriented to person, place, and time. Judgement and insight are appropriate

## 2023-04-06 IMAGING — CT CT ABD-PELV W/ CM
2 of 5 series · 15 of 46 positions shown, 17 images · IV contrast (APPLIED)
Comparison: CT done on 01/22/2021

CLINICAL DATA: Status post partial colectomy

EXAM:
CT ABDOMEN AND PELVIS WITH CONTRAST
TECHNIQUE: Multidetector CT imaging of the abdomen and pelvis was performed
using the standard protocol following bolus administration of
intravenous contrast.

[Series 2: routine abd/pel with · axial · 0.87mm/px · z∈[-640,-200]mm · 12 of 102 slices shown, 14 images]
[im 7/102  soft-tissue]
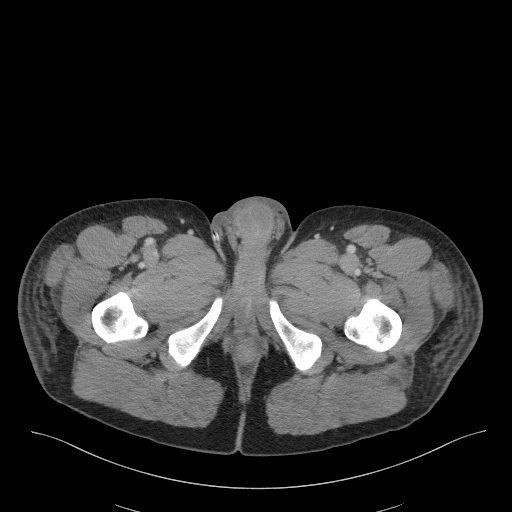
[im 7/102  bone]
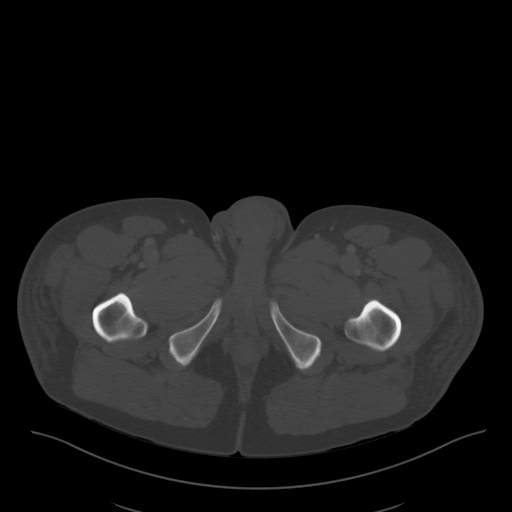
[im 14/102  soft-tissue]
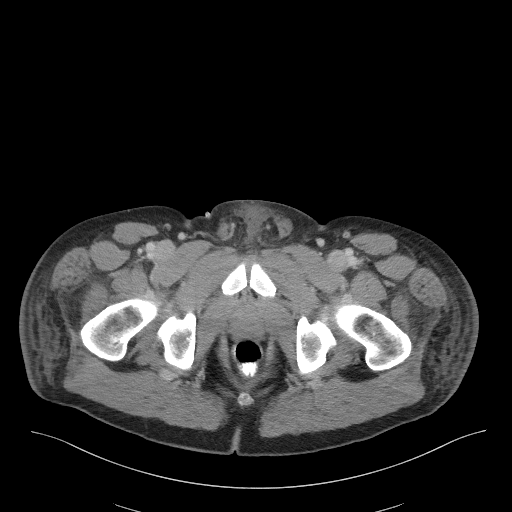
[im 21/102  soft-tissue]
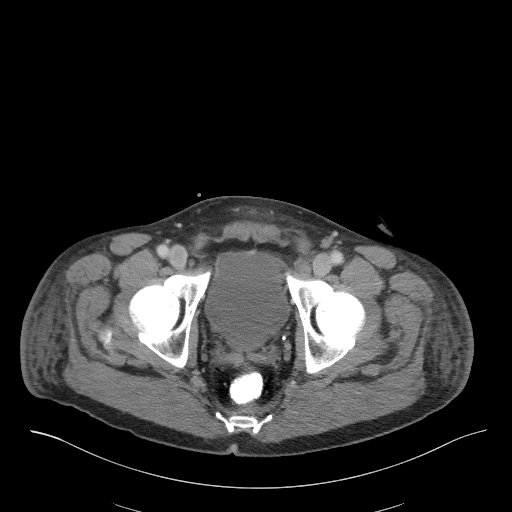
[im 34/102  soft-tissue]
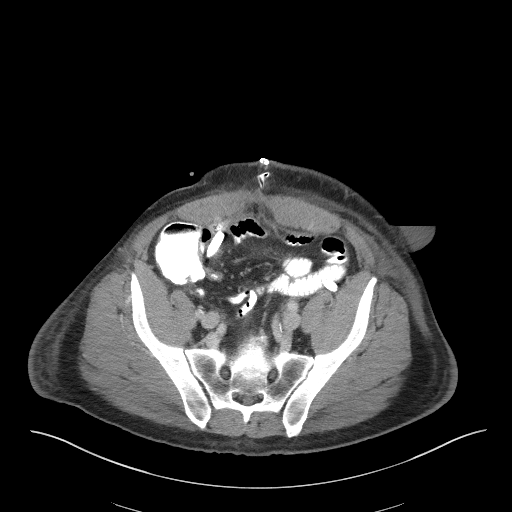
[im 41/102  soft-tissue]
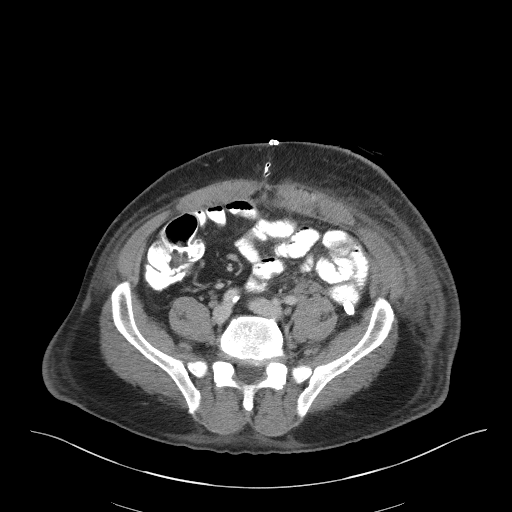
[im 48/102  soft-tissue]
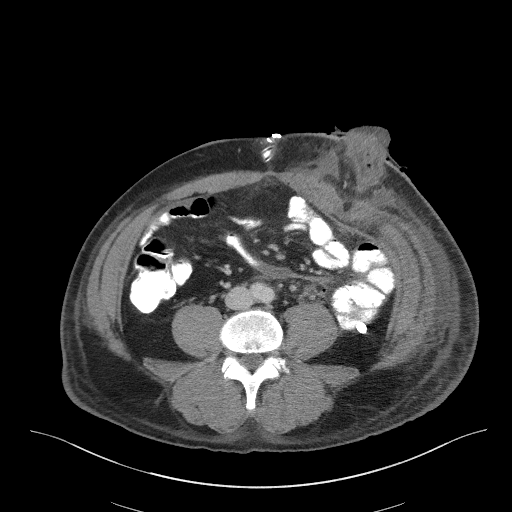
[im 54/102  soft-tissue]
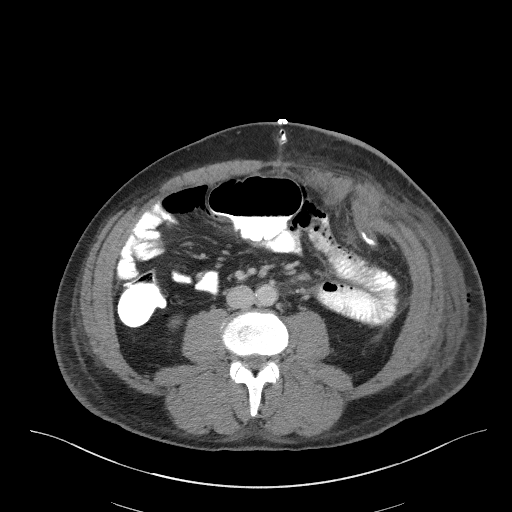
[im 61/102  soft-tissue]
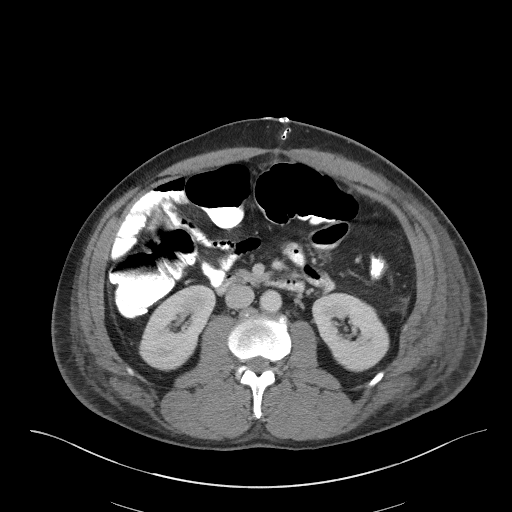
[im 68/102  soft-tissue]
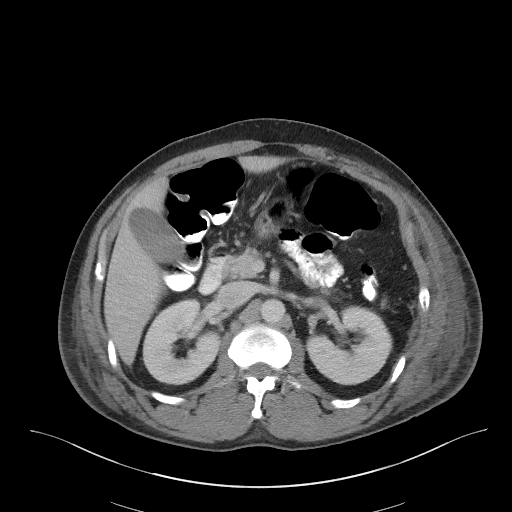
[im 68/102  bone]
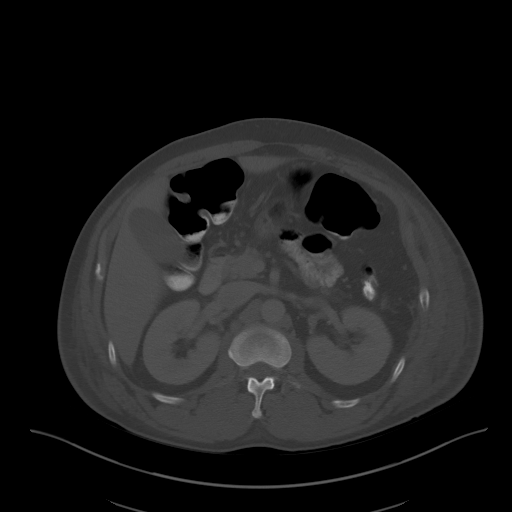
[im 81/102  soft-tissue]
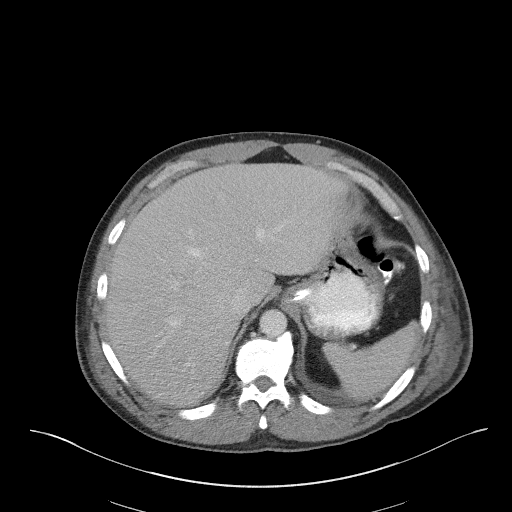
[im 88/102  soft-tissue]
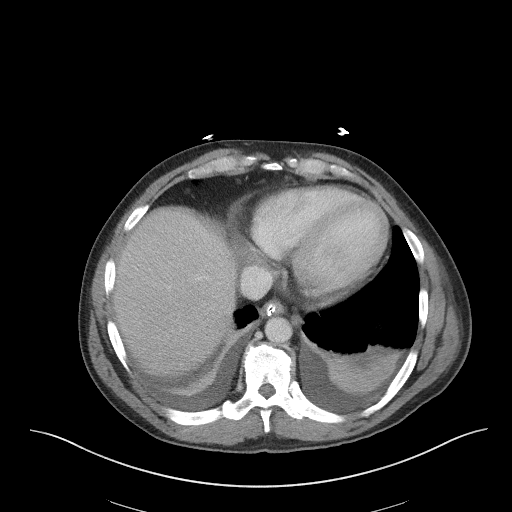
[im 95/102  soft-tissue]
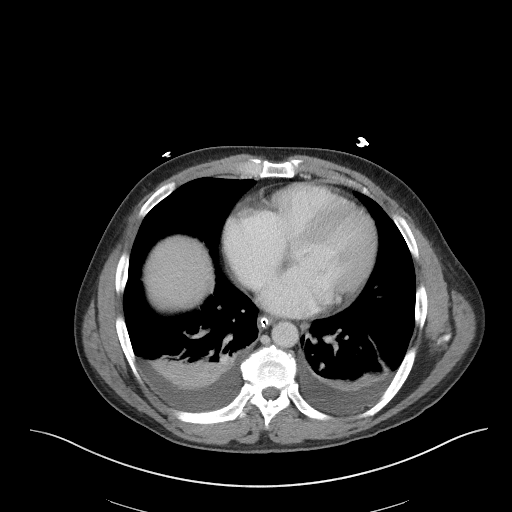

[Series 5: coronal st · coronal · 0.80mm/px · 3 of 102 slices shown]
[im 34/102  soft-tissue]
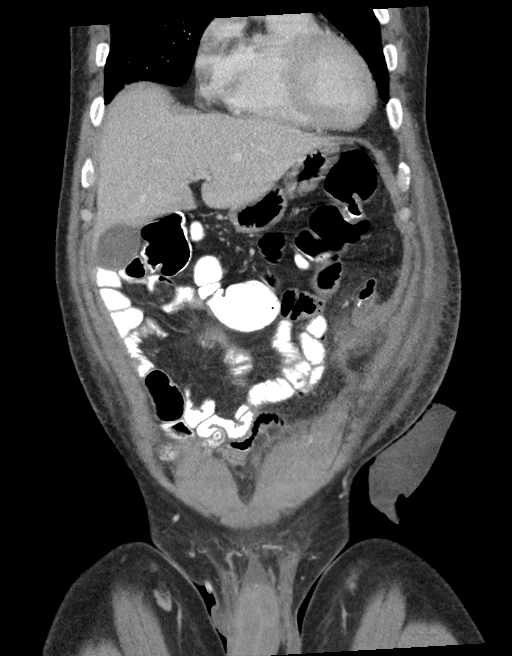
[im 45/102  soft-tissue]
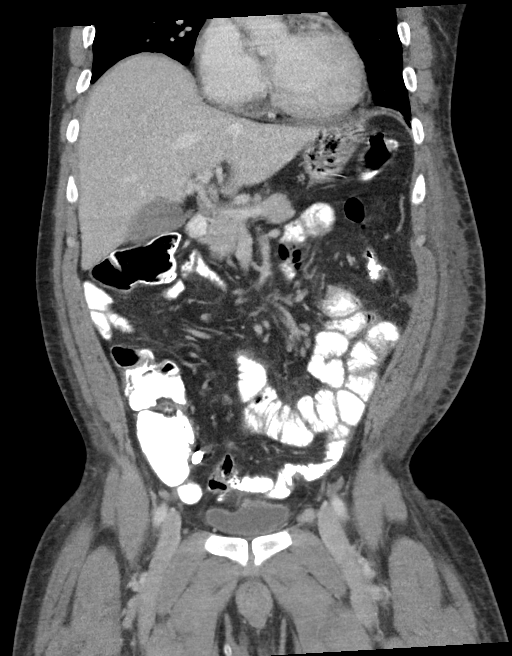
[im 57/102  soft-tissue]
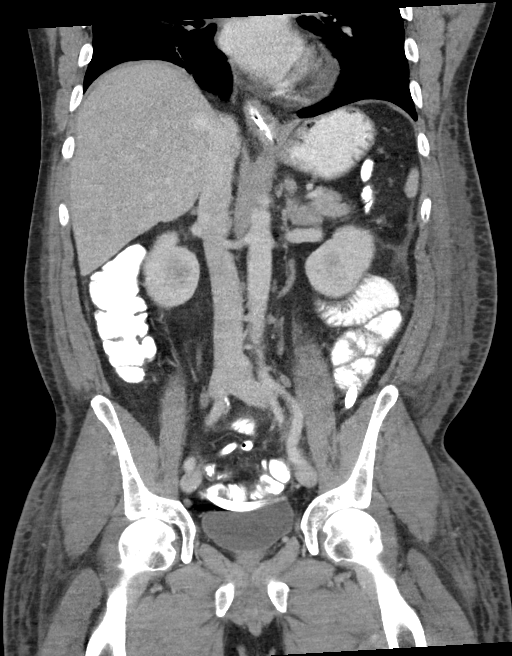

[15 of 46 positions shown; findings below may reference images not displayed]

RADIATION DOSE REDUCTION: This exam was performed according to the
departmental dose-optimization program which includes automated
exposure control, adjustment of the mA and/or kV according to
patient size and/or use of iterative reconstruction technique.

CONTRAST:  100mL OMNIPAQUE IOHEXOL 300 MG/ML  SOLN
FINDINGS: Lower chest: Small bilateral pleural effusions are seen. There are
infiltrates in the lower lung fields with air bronchogram.

Hepatobiliary: Liver measures 17.9 cm in length. No focal
abnormality is seen. Gallbladder is unremarkable.

Pancreas: No focal abnormality is seen.

Spleen: Unremarkable.

Adrenals/Urinary Tract: Adrenals are not enlarged. There is no
hydronephrosis. There are no renal or ureteral stones. Urinary
bladder is unremarkable.

Stomach/Bowel: Stomach is unremarkable. Tip of enteric tube is seen
in the stomach. There is no significant small bowel dilation.
Appendix is not dilated. There is evidence of interval resection
sigmoid colon. Colostomy is seen in the left mid abdomen. There is a
surgical drain in the left pericolic region extending to the right
lower abdomen with catheter exiting through right rectus muscle.
There is contrast in the lumen of rectum suggesting possible
ileorectal anastomosis are rectal contrast administration. There is
no evidence of a rectal tube.

Vascular/Lymphatic: Vascular structures are essentially
unremarkable. There are scattered subcentimeter nodes in the
mesentery and retroperitoneum with no significant interval change.

Reproductive: Unremarkable.

Other: There is no ascites. Small pocket of air is noted adjacent to
the dome of the urinary bladder, possibly residual change from
recent surgery. Surgical clips are seen in the anterior abdominal
wall. There is possible surgical drain posterior to the skin clips
in the anterior abdominal wall without any loculated fluid
collections. There is subcutaneous edema in the anterior abdominal
wall, especially in the left flank without loculated fluid
collections.

Musculoskeletal: Unremarkable.
IMPRESSION: Interval surgical resection of sigmoid colon. Colostomy is noted in
the left mid abdomen. There is no evidence of intestinal
obstruction. No abnormal loculated fluid collections are seen in the
abdomen and pelvis. There is no hydronephrosis. There is contrast in
the lumen of rectum.

Surgical drain is seen in left paracolic gutter extending to the
right lower abdomen exiting through the right rectus muscle. There
is surgical drain in the subcutaneous plane posterior to the
surgical clips in the anterior abdominal wall. There is diffuse
edema in the subcutaneous plane in the anterior abdominal wall, more
so on the left side without any loculated fluid collections.

Small bilateral pleural effusions. Infiltrates in both lower lung
fields may suggest atelectasis/pneumonia.

## 2023-04-09 IMAGING — DX DG CHEST 1V PORT
1 series · 1 of 1 positions shown · non-contrast
Comparison: Previous studies including the examination done earlier
today

CLINICAL DATA: PICC line placement

EXAM:
PORTABLE CHEST 1 VIEW

[chest ap]
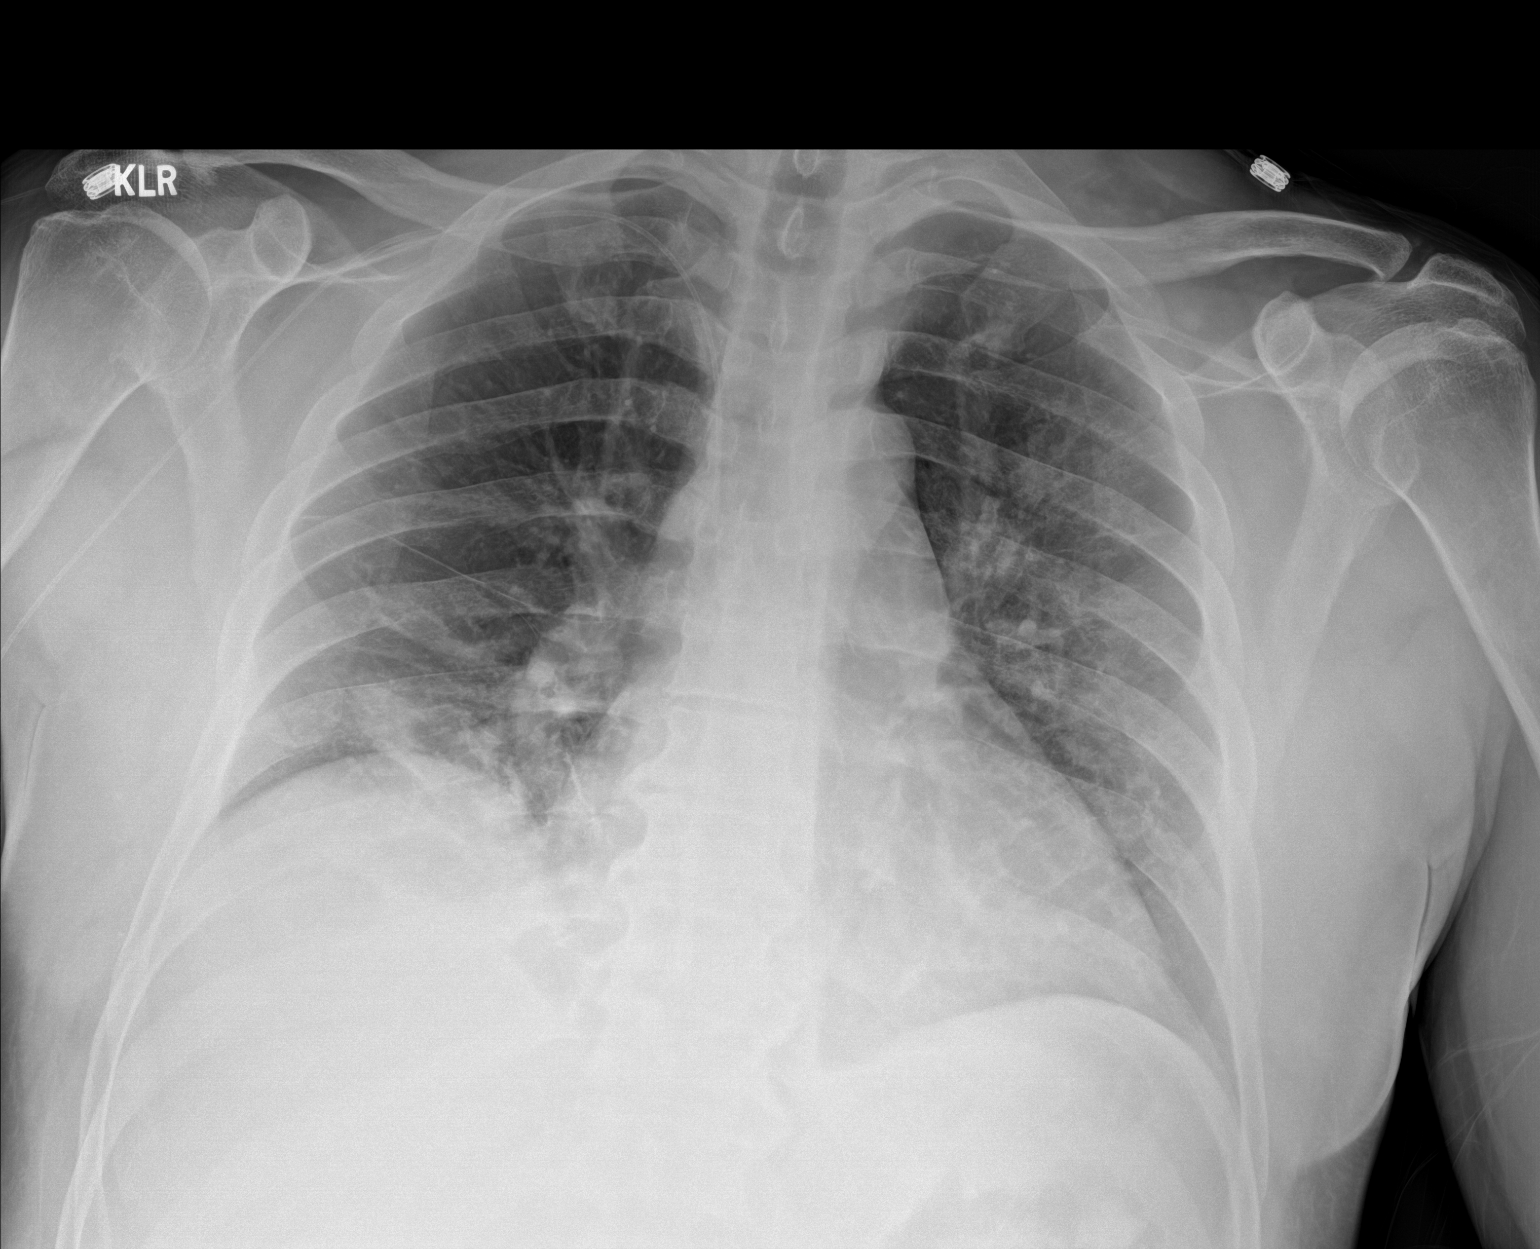

[1 of 1 positions shown; findings below may reference images not displayed]

FINDINGS: Transverse diameter of heart is increased. There are linear patchy
infiltrates in the right lower lung fields. There are faint alveolar
densities in the left parahilar region. Lateral CP angles are clear.
There is no pneumothorax. Tip of PICC line is seen in the course of
superior vena cava.
IMPRESSION: Linear patchy infiltrates in right lower lung fields suggest
atelectasis/pneumonia. There are faint alveolar densities in the
left parahilar region suggesting pneumonia.

## 2023-09-08 NOTE — Telephone Encounter (Signed)
 Copied from CRM [id number]. Topic: New Scheduling - Other New Intake >> Sep 08, 2023 11:42 AM Almeda NOVAK wrote: Holbert, Caples 11/03/1965 [phone]  We just received a call regarding this patient. They are requesting a new appointment for a Lung Cancer Screening. Please follow up with the patient for scheduling.   Referral: Yes  Thank you,  Almeda GORMAN [facility] [phone]

## 2023-09-16 DIAGNOSIS — N5319 Other ejaculatory dysfunction: Secondary | ICD-10-CM | POA: Insufficient documentation

## 2023-09-16 NOTE — Assessment & Plan Note (Addendum)
 Delayed ejaculation /dry ejaculation History of complicated diverticulitis, ex-lap, colectomy   I think high likelihood related to prior bowel surgery-could have sustained partial injury or development of scar tissue near hypogastric plexus, sympathetic fibers.  Unfortunately there is little evidence in this space, and I was realistic and our outcomes.  I am happy to try an alpha adrenergic medication to see if we can improve emission/ejaculation.  He does not have a history of CAD, hypertension, urinary retention.  I reviewed pseudoephedrine , side effect profile and as needed dosing.  If he does not reach his treatment goals or if he incur side effects we will discontinue the medication.   - Trial of 60 mg pseudo ephedrine , take 60 minutes prior to sexual activity.  Limit 1 dose every 24 hours.  - Continue 50 mg sildenafil, may uptitrate to 100 mg max strength as needed

## 2023-09-16 NOTE — Progress Notes (Signed)
 09/22/23 11:47 AM   [name] 1965-04-29 [id number]  CC: Ejaculatory dysfunction   HPI: 58 year old male here for initial evaluation of ejaculatory dysfunction History of perforated diverticulitis (s/p ex lap, partial colectomy with colostomy 2023, s/p colostomy takedown/continuity in 2023) Began having issues with ejaculation immediately after surgery Also slight worsening in ED although historic -on half tablet 100 mg Viagra Primary complaint is delayed ejaculation and dry ejaculation Initial erection and maintenance is not overly bothersome on Viagra  No other GU issues, no prior GU surgeries   PMH: No past medical history on file.  Surgical History: Past Surgical History:  Procedure Laterality Date   COLECTOMY WITH COLOSTOMY CREATION/HARTMANN PROCEDURE N/A 01/23/2021   Procedure: COLECTOMY WITH COLOSTOMY CREATION/HARTMANN PROCEDURE;  Surgeon: Desiderio Schanz, MD;  Location: [facility] ORS;  Service: General;  Laterality: N/A;   COLONOSCOPY WITH PROPOFOL  N/A 03/26/2020   Procedure: COLONOSCOPY WITH PROPOFOL ;  Surgeon: Therisa Bi, MD;  Location: [facility] ENDOSCOPY;  Service: Gastroenterology;  Laterality: N/A;   I & D EXTREMITY Left 06/02/2018   Procedure: IRRIGATION AND DEBRIDEMENT LEFT FOREARM AND WOUND VAC PLACEMENT;  Surgeon: Kathlynn Sharper, MD;  Location: [facility] ORS;  Service: Orthopedics;  Laterality: Left;   LAPAROTOMY N/A 01/23/2021   Procedure: EXPLORATORY LAPAROTOMY;  Surgeon: Desiderio Schanz, MD;  Location: [facility] ORS;  Service: General;  Laterality: N/A;    Family History: Family History  Problem Relation Age of Onset   Dementia Mother    Cancer Father        Blood   COPD Father     Social History:  reports that he has quit smoking. His smoking use included cigarettes. He has a 30 pack-year smoking history. He quit smokeless tobacco use about 2 years ago. He reports current alcohol use. He reports current drug use. Drug: Methamphetamines.      Physical Exam: BP (!)  146/91 (BP Location: Left Arm, Patient Position: Sitting, Cuff Size: Normal)   Pulse 80   Wt 206 lb (93.4 kg)   SpO2 100%   BMI 30.42 kg/m    Constitutional:  Alert and oriented, No acute distress. Cardiovascular: No clubbing, cyanosis, or edema. Respiratory: Normal respiratory effort, no increased work of breathing. GI: Nondistended GU: Uncircumcised phallus, morphologically normal penoscrotal exam, normal meatus Skin: No rashes, bruises or suspicious lesions. Neurologic: Grossly intact, no focal deficits, moving all 4 extremities. Psychiatric: Normal mood and affect.  Laboratory Data: N/A   Pertinent Imaging: N/A     Assessment & Plan:    Other ejaculatory dysfunction Assessment & Plan: Delayed ejaculation /dry ejaculation History of complicated diverticulitis, ex-lap, colectomy   I think high likelihood related to prior bowel surgery-could have sustained partial injury or development of scar tissue near hypogastric plexus, sympathetic fibers.  Unfortunately there is little evidence in this space, and I was realistic and our outcomes.  I am happy to try an alpha adrenergic medication to see if we can improve emission/ejaculation.  He does not have a history of CAD, hypertension, urinary retention.  I reviewed pseudoephedrine , side effect profile and as needed dosing.  If he does not reach his treatment goals or if he incur side effects we will discontinue the medication.   - Trial of 60 mg pseudo ephedrine , take 60 minutes prior to sexual activity.  Limit 1 dose every 24 hours.  - Continue 50 mg sildenafil, may uptitrate to 100 mg max strength as needed   Other orders -     Pseudoephedrine  HCl; Take 60 minutes prior  to sexual activity to help with ejaculatory dysfunction. Do not take more than 1 every 24 hours.  Dispense: 30 tablet; Refill: 3      Penne Skye, MD 09/22/2023  [facility] [address] [place], [place] [address] [phone]

## 2023-09-22 ENCOUNTER — Ambulatory Visit (INDEPENDENT_AMBULATORY_CARE_PROVIDER_SITE_OTHER): Admitting: Urology

## 2023-09-22 VITALS — BP 146/91 | HR 80 | Wt 206.0 lb

## 2023-09-22 DIAGNOSIS — N5319 Other ejaculatory dysfunction: Secondary | ICD-10-CM | POA: Diagnosis not present

## 2023-09-22 MED ORDER — PSEUDOEPHEDRINE HCL 60 MG PO TABS: ORAL_TABLET | ORAL | 3 refills | Status: AC

## 2023-09-24 ENCOUNTER — Ambulatory Visit: Admitting: Urology

## 2023-11-23 ENCOUNTER — Emergency Department
Admission: EM | Admit: 2023-11-23 | Discharge: 2023-11-23 | Disposition: A | Discharge status: Home / Self Care | Attending: Emergency Medicine | Admitting: Emergency Medicine

## 2023-11-23 ENCOUNTER — Ambulatory Visit

## 2023-11-23 ENCOUNTER — Other Ambulatory Visit: Payer: Self-pay

## 2023-11-23 DIAGNOSIS — Z980 Intestinal bypass and anastomosis status: Secondary | ICD-10-CM | POA: Diagnosis not present

## 2023-11-23 DIAGNOSIS — R030 Elevated blood-pressure reading, without diagnosis of hypertension: Secondary | ICD-10-CM | POA: Insufficient documentation

## 2023-11-23 DIAGNOSIS — R194 Change in bowel habit: Secondary | ICD-10-CM | POA: Diagnosis not present

## 2023-11-23 DIAGNOSIS — K31819 Angiodysplasia of stomach and duodenum without bleeding: Secondary | ICD-10-CM | POA: Diagnosis not present

## 2023-11-23 DIAGNOSIS — Z860101 Personal history of adenomatous and serrated colon polyps: Secondary | ICD-10-CM | POA: Diagnosis not present

## 2023-11-23 DIAGNOSIS — K5909 Other constipation: Secondary | ICD-10-CM | POA: Diagnosis not present

## 2023-11-23 DIAGNOSIS — K2100 Gastro-esophageal reflux disease with esophagitis, without bleeding: Secondary | ICD-10-CM | POA: Diagnosis not present

## 2023-11-23 LAB — CBC WITH DIFFERENTIAL/PLATELET
Abs Immature Granulocytes: 0.01 K/uL (ref 0.00–0.07)
Basophils Absolute: 0.1 K/uL (ref 0.0–0.1)
Basophils Relative: 1 %
Eosinophils Absolute: 0.2 K/uL (ref 0.0–0.5)
Eosinophils Relative: 4 %
HCT: 41.3 % (ref 39.0–52.0)
Hemoglobin: 13.6 g/dL (ref 13.0–17.0)
Immature Granulocytes: 0 %
Lymphocytes Relative: 37 %
Lymphs Abs: 1.6 K/uL (ref 0.7–4.0)
MCH: 31.6 pg (ref 26.0–34.0)
MCHC: 32.9 g/dL (ref 30.0–36.0)
MCV: 95.8 fL (ref 80.0–100.0)
Monocytes Absolute: 0.4 K/uL (ref 0.1–1.0)
Monocytes Relative: 8 %
Neutro Abs: 2.2 K/uL (ref 1.7–7.7)
Neutrophils Relative %: 50 %
Platelets: 259 K/uL (ref 150–400)
RBC: 4.31 MIL/uL (ref 4.22–5.81)
RDW: 12.4 % (ref 11.5–15.5)
WBC: 4.3 K/uL (ref 4.0–10.5)
nRBC: 0 % (ref 0.0–0.2)

## 2023-11-23 LAB — BASIC METABOLIC PANEL WITH GFR
Anion gap: 13 (ref 5–15)
BUN: 13 mg/dL (ref 6–20)
CO2: 22 mmol/L (ref 22–32)
Calcium: 9.3 mg/dL (ref 8.9–10.3)
Chloride: 105 mmol/L (ref 98–111)
Creatinine, Ser: 1.02 mg/dL (ref 0.61–1.24)
GFR, Estimated: >60 mL/min (ref >60)
Glucose, Bld: 154 mg/dL — ABNORMAL HIGH (ref 70–99)
Potassium: 3.8 mmol/L (ref 3.5–5.1)
Sodium: 139 mmol/L (ref 135–145)

## 2023-11-23 NOTE — Discharge Instructions (Signed)
 You were seen in the emergency room today for elevated blood pressure.  Your blood pressure was improved but still high in the emergency department.  Please keep a log of your blood pressure for the next several days.  Arrange follow-up with your primary care doctor for reassessment and consideration of if you need to start on medication.  Return to the ER for any new or worsening symptoms including chest pain, shortness of breath, headache, numbness, ting, weakness, or any other new or concerning symptoms.

## 2023-11-23 NOTE — ED Triage Notes (Signed)
 Pt just discharged from outpatient colonoscopy and was told to come here because DC blood pressure 203/127. Pt denies hx of HTN and denies any symptoms at this time.

## 2023-11-23 NOTE — ED Provider Notes (Signed)
 [facility] Provider Note    Event Date/Time   First MD Initiated Contact with Patient 11/23/23 1127     (approximate)   History   Hypertension   HPI  Hunter [name] is a 58 year old male presenting to the emergency department for evaluation of elevated blood pressure reading.  Patient had had a routine colonoscopy today for surveillance in the setting of prior Hartman's procedure that has now been reversed.  While there he was noted to have elevated blood pressures initially with systolics of 180s, post procedurally his blood pressure was 203/127.  Sent to the ER in the setting of this.  Denies chest pain, shortness of breath, headache, numbness, tingling, focal weakness.  No history of hypertension.     Physical Exam   Triage Vital Signs: ED Triage Vitals  Encounter Vitals Group     BP 11/23/23 1107 (!) 163/109     Girls Systolic BP Percentile --      Girls Diastolic BP Percentile --      Boys Systolic BP Percentile --      Boys Diastolic BP Percentile --      Pulse Rate 11/23/23 1107 73     Resp 11/23/23 1107 16     Temp 11/23/23 1108 (!) 97.1 F (36.2 C)     Temp Source 11/23/23 1108 Oral     SpO2 11/23/23 1107 100 %     Weight 11/23/23 1110 210 lb (95.3 kg)     Height 11/23/23 1110 5' 9 (1.753 m)     Head Circumference --      Peak Flow --      Pain Score 11/23/23 1107 0     Pain Loc --      Pain Education --      Exclude from Growth Chart --     Most recent vital signs: Vitals:   11/23/23 1108 11/23/23 1138  BP:  (!) 147/103  Pulse:    Resp:    Temp: (!) 97.1 F (36.2 C)   SpO2:       General: Awake, interactive  CV:  Good peripheral perfusion Resp:  Unlabored respirations, lungs clear to auscultation Abd:  Nondistended, soft, nontender Neuro:  Symmetric facial movement, fluid speech, 5 out of 5 strength of bilateral upper and lower extremities, normal finger-to-nose testing   ED Results / Procedures / Treatments    Labs (all labs ordered are listed, but only abnormal results are displayed) Labs Reviewed  BASIC METABOLIC PANEL WITH GFR - Abnormal; Notable for the following components:      Result Value   Glucose, Bld 154 (*)    All other components within normal limits  CBC WITH DIFFERENTIAL/PLATELET     EKG EKG independently reviewed and interpreted by myself demonstrates:  EKG demonstrates normal sinus rhythm at a rate of 74, PR 180, QRS 86, QTc 441, nonspecific ST changes, no STEMI  RADIOLOGY Imaging independently reviewed and interpreted by myself demonstrates:   Formal Radiology Read:  No results found.  PROCEDURES:  Critical Care performed: No  Procedures   MEDICATIONS ORDERED IN ED: Medications - No data to display   IMPRESSION / MDM / ASSESSMENT AND PLAN / ED COURSE  I reviewed the triage vital signs and the nursing notes.  Differential diagnosis includes, but is not limited to, elevated blood pressure reading in the setting of planned procedure, medication adverse effect, no chest pain, shortness of breath, headache, focal deficits suggestive of hypertensive emergency  Patient's  presentation is most consistent with acute presentation with potential threat to life or bodily function.  58 year old male presenting with elevated blood pressure reading.  Here, blood pressure improved, does remain elevated.  Labs sent from triage which are overall reassuring.  EKG without acute ischemic findings.  Presentation is not consistent with hypertensive emergency.  Patient denies history of hypertension, will avoid starting medication based on single day of values.  Did instruct patient to keep a log of his blood pressure readings and follow with his primary care doctor to discuss if he does need to start an antihypertensive regimen.  Strict return precautions provided.  Patient discharged in stable condition.    FINAL CLINICAL IMPRESSION(S) / ED DIAGNOSES   Final diagnoses:  Elevated  blood pressure reading     Rx / DC Orders   ED Discharge Orders     None        Note:  This document was prepared using Dragon voice recognition software and may include unintentional dictation errors.   Levander Slate, MD 11/23/23 [phone]
